# Patient Record
Sex: Male | Born: 1940 | Race: Black or African American | Hispanic: No | Marital: Single | Smoking: Former smoker
Health system: Southern US, Community
[De-identification: ages and names within clinical notes are randomized; demographics above are authoritative.]

## PROBLEM LIST (undated history)

## (undated) DIAGNOSIS — F410 Panic disorder [episodic paroxysmal anxiety] without agoraphobia: Secondary | ICD-10-CM

## (undated) DIAGNOSIS — J449 Chronic obstructive pulmonary disease, unspecified: Secondary | ICD-10-CM

---

## 2004-05-05 ENCOUNTER — Emergency Department: Payer: Self-pay | Admitting: Emergency Medicine

## 2004-06-11 ENCOUNTER — Emergency Department: Payer: Self-pay | Admitting: Emergency Medicine

## 2005-01-09 ENCOUNTER — Emergency Department: Payer: Self-pay | Admitting: Emergency Medicine

## 2005-09-02 ENCOUNTER — Emergency Department: Payer: Self-pay | Admitting: Emergency Medicine

## 2005-09-10 ENCOUNTER — Ambulatory Visit: Payer: Self-pay | Admitting: Internal Medicine

## 2005-10-11 ENCOUNTER — Ambulatory Visit: Payer: Self-pay | Admitting: Internal Medicine

## 2005-10-26 ENCOUNTER — Ambulatory Visit: Payer: Self-pay | Admitting: Internal Medicine

## 2007-01-01 ENCOUNTER — Emergency Department: Payer: Self-pay | Admitting: Emergency Medicine

## 2007-02-16 ENCOUNTER — Other Ambulatory Visit: Payer: Self-pay

## 2007-02-16 ENCOUNTER — Emergency Department: Payer: Self-pay | Admitting: Emergency Medicine

## 2007-03-03 ENCOUNTER — Ambulatory Visit: Payer: Self-pay | Admitting: Cardiology

## 2007-06-05 ENCOUNTER — Emergency Department: Payer: Self-pay | Admitting: Internal Medicine

## 2007-07-24 ENCOUNTER — Emergency Department: Payer: Self-pay | Admitting: Emergency Medicine

## 2008-01-30 ENCOUNTER — Emergency Department: Payer: Self-pay | Admitting: Emergency Medicine

## 2008-01-30 ENCOUNTER — Other Ambulatory Visit: Payer: Self-pay

## 2009-07-16 ENCOUNTER — Emergency Department: Payer: Self-pay | Admitting: Emergency Medicine

## 2009-09-17 ENCOUNTER — Emergency Department: Payer: Self-pay | Admitting: Emergency Medicine

## 2010-09-30 ENCOUNTER — Emergency Department: Payer: Self-pay | Admitting: Unknown Physician Specialty

## 2010-11-10 NOTE — Assessment & Plan Note (Signed)
[facility] OFFICE NOTE   NAME:Chris Finley, Chris Finley                         MRN:          [id number]  DATE:03/03/2007                            DOB:          14-Mar-1941    CARDIOLOGY CONSULTATION  I was asked by Dr. Corrinne Eagle to follow up with Chris Finley, a very  pleasant 70 year old black gentleman who had presyncope on the morning  of February 22, 2007.   That morning, he had bent over to tie his shoes; and then tried to  straighten up, and got lightheaded.  He had no other symptoms.  He fell  over, and was unable to get up.  He felt nauseated, and then vomited.  He did not lose consciousness.  He went to the emergency room where he  had complete evaluation.   His EKG showed normal sinus rhythm with left axis deviation and left  anterior fascicular block.  They called it a right bundle, but I do not  think that it is a true right bundle.  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  This was  in the left frontal, subcortical area.  Chest x-ray showed no acute  cardiopulmonary disease.  He had COPD and hyperinflated lungs.  His  blood work showed a negative CPK/MB, normal CBC, except for platelet  count of 121,000; normal comprehensive metabolic panel, except a  nonfasting blood sugar of 161; and a negative troponin.  His D-dimer was  borderline at 0.53.  He was discharged home.  He relates a couple of  episodes like this in the past, over the last five years, he never lost  consciousness.   PAST MEDICAL HISTORY:  He is currently on no medications.  He has no allergies.  He does smoke, about a pack per day, and has for 45 years.  He does not  drink alcohol.  He drinks caffeinated beverages.   SURGICAL HISTORY:  None.   FAMILY HISTORY:  Noncontributory.   SOCIAL HISTORY:  Single.  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   REVIEW OF SYSTEMS:   Other than in the HPI is negative.  He has a history  of some anxiety, otherwise negative.   PHYSICAL EXAM:  He is very pleasant.  His blood pressure lying was 113/72, heart rate of 87, when we sat him  up his blood pressure went to 125/90 and heart rate increased to 93; and  then standing it dropped to 118/80, after two minutes 112/75, and after  five minutes 117/79 with no change in heart rate.  He was not  symptomatic.  His height is 5 feet 7 inches.  He weighs 149 pounds.  HEENT:  Normocephalic, atraumatic.  PERRLA.  Extraocular movements are  intact.  Sclerae muddy.  He has a mustache.  Facial symmetry is normal.  NECK:  Carotid upstrokes are equal bilaterally without bruits.  No JVD.  Thyroid is not enlarged.  Trachea is midline.  LUNGS:  Revealed  decreased breath sounds throughout, but no rales or  rhonchi.  HEART:  Reveals a nondisplaced PMI.  He has a split S1, normal S2.  No  gallop or rub.  ABDOMINAL EXAM:  Soft, good bowel sounds, no midline bruit, no  hepatomegaly.  EXTREMITIES:  No cyanosis clubbing or edema.  There is no sign of DVT.  Pulses are intact.  NEUROLOGIC:  Grossly intact.  SKIN:  Unremarkable.   ASSESSMENT:  1. Orthostatic hypotension.  Though he does not drop his pressure it      is blunted; and his history and symptoms go along with orthostasis.  2. Left anterior fascicular block.  This is totally unrelated.  3. Severe chronic obstructive pulmonary disease.  RECOMMENDATION.  1. Stay well-hydrated.  2. Orthostatic precautions and how to avoid this were reviewed at      length.  3. Discontinue smoking or cut back as much as possible.  4. We will see him back on a p.r.n. basis.     Thomas C. Daleen Squibb, MD, [facility]  Electronically Signed    TCW/MedQ  DD: 03/03/2007  DT: 03/03/2007  Job #: [id number]   cc:   [facility] [address] [place]  [place] [address]  Corrinne Eagle -- Emergency room

## 2012-01-04 ENCOUNTER — Emergency Department: Payer: Self-pay | Admitting: Emergency Medicine

## 2013-05-06 ENCOUNTER — Emergency Department: Payer: Self-pay | Admitting: Emergency Medicine

## 2013-05-06 LAB — CBC
HCT: 45.3 % (ref 40.0–52.0)
HGB: 14.9 g/dL (ref 13.0–18.0)
MCH: 28.8 pg (ref 26.0–34.0)
MCV: 87 fL (ref 80–100)
RBC: 5.18 10*6/uL (ref 4.40–5.90)
RDW: 15.5 % — ABNORMAL HIGH (ref 11.5–14.5)
WBC: 4 10*3/uL (ref 3.8–10.6)

## 2013-05-06 LAB — URINALYSIS, COMPLETE
Bacteria: NONE SEEN
Blood: NEGATIVE
Glucose,UR: 50 mg/dL (ref 0–75)
Ketone: NEGATIVE
Leukocyte Esterase: NEGATIVE
Ph: 6 (ref 4.5–8.0)
Protein: NEGATIVE
RBC,UR: 1 /HPF (ref 0–5)
Squamous Epithelial: NONE SEEN
WBC UR: 1 /HPF (ref 0–5)

## 2013-05-06 LAB — TROPONIN I: Troponin-I: <0.02 ng/mL

## 2013-05-06 LAB — BASIC METABOLIC PANEL
Anion Gap: 3 — ABNORMAL LOW (ref 7–16)
EGFR (African American): >60
Glucose: 172 mg/dL — ABNORMAL HIGH (ref 65–99)
Sodium: 140 mmol/L (ref 136–145)

## 2013-06-01 ENCOUNTER — Ambulatory Visit: Payer: Self-pay | Admitting: Otolaryngology

## 2014-04-14 IMAGING — CT CT ORBITS WITHOUT CONTRAST
3 of 6 series · 15 of 40 positions shown, 18 images · non-contrast
Comparison: CT head 02/16/2007

CLINICAL DATA: Right-sided hearing loss.  Rule out cholesteatoma

EXAM:
CT TEMPORAL BONES WITHOUT CONTRAST
TECHNIQUE: Axial and coronal plane CT imaging of the petrous temporal bones was
performed with thin-collimation image reconstruction. No intravenous
contrast was administered. Multiplanar CT image reconstructions were
also generated.

[Series 3: ax soft · axial · 0.33mm/px · z∈[-2,+14]mm · 2 of 26 slices shown]
[im 9/26  brain]
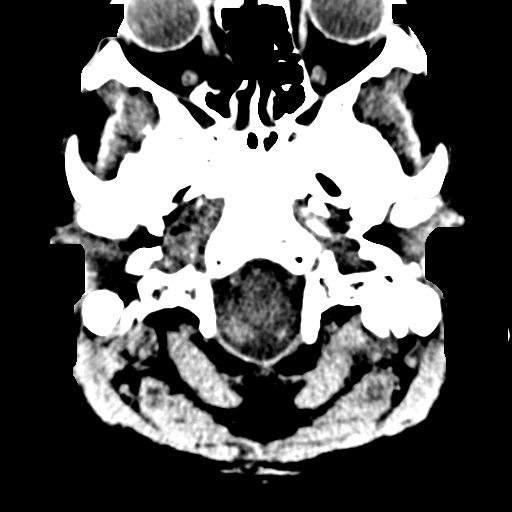
[im 17/26  brain]
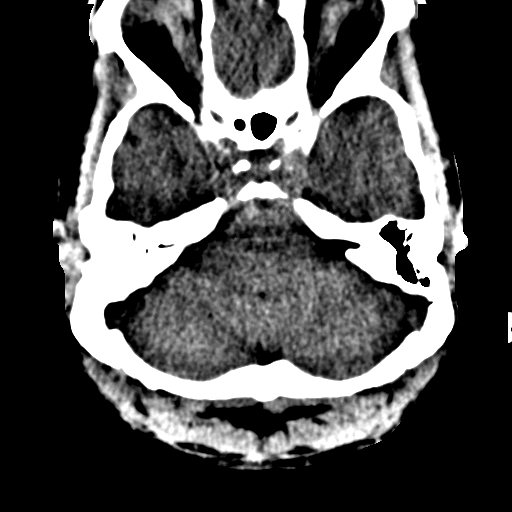

[Series 5: ax mag right · axial · 0.20mm/px · z∈[-14,+28]mm · 11 of 86 slices shown, 14 images]
[im 8/86  brain]
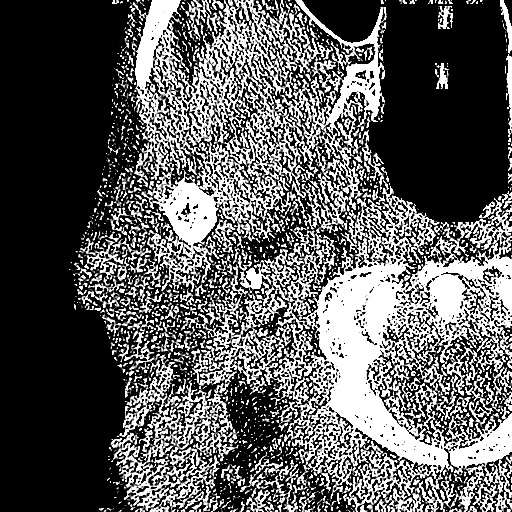
[im 8/86  bone]
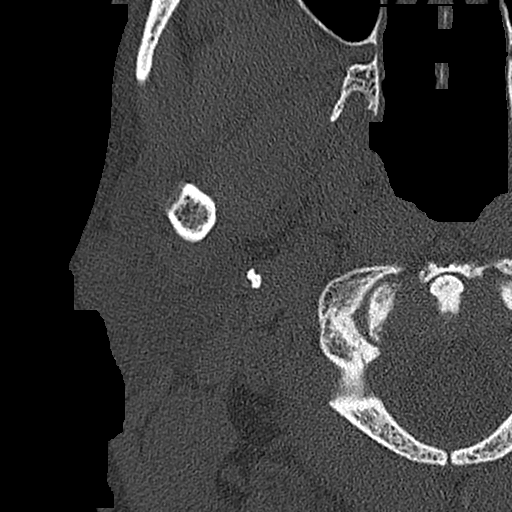
[im 15/86  bone]
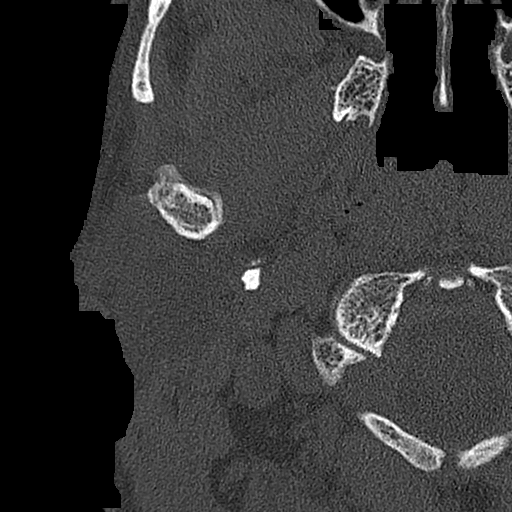
[im 22/86  bone]
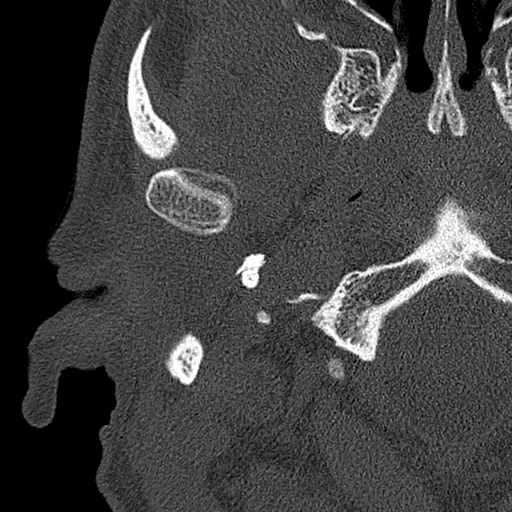
[im 29/86  bone]
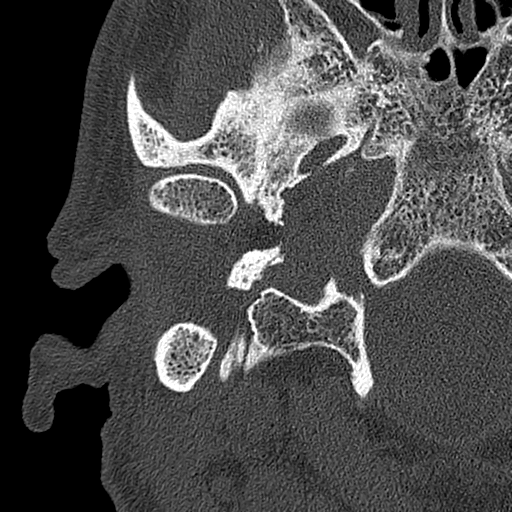
[im 36/86  brain]
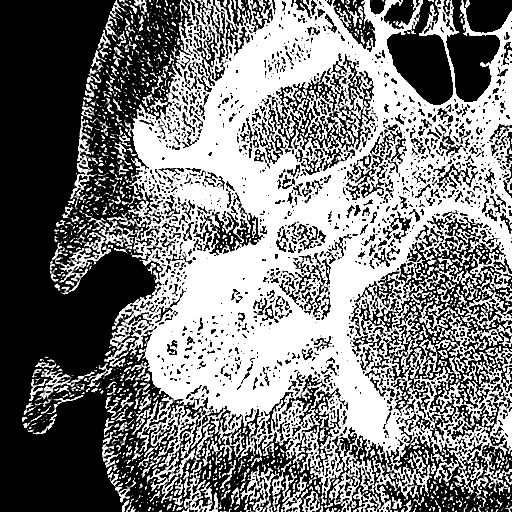
[im 36/86  bone]
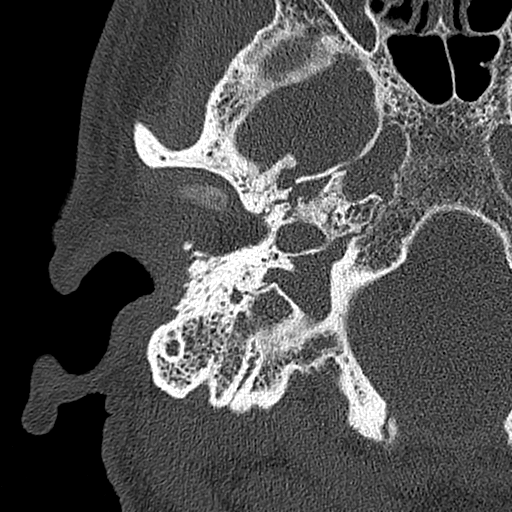
[im 43/86  bone]
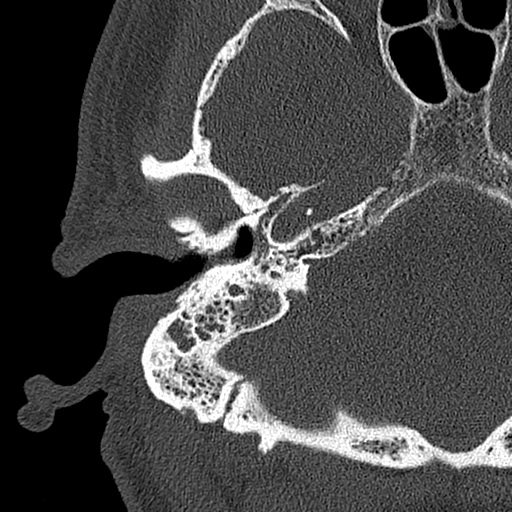
[im 50/86  bone]
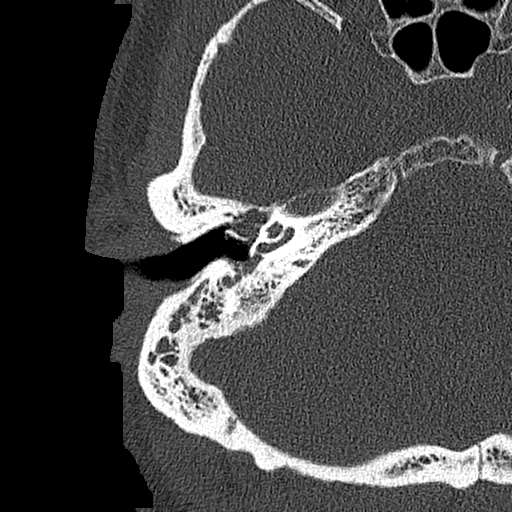
[im 57/86  bone]
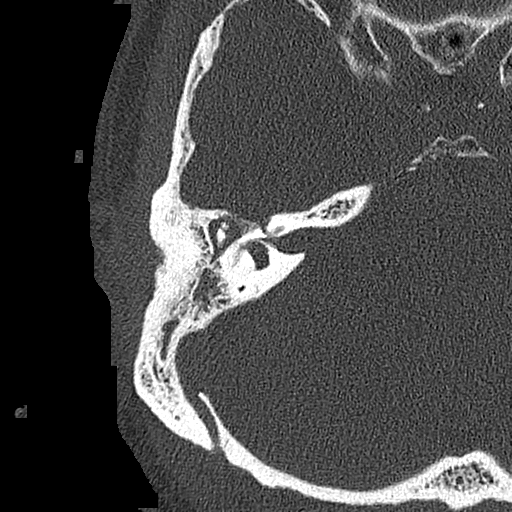
[im 64/86  brain]
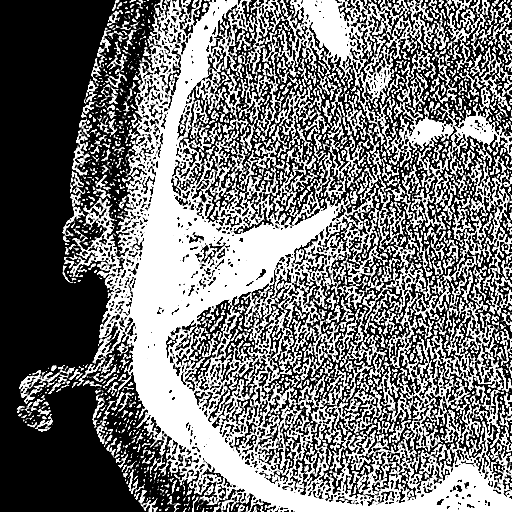
[im 64/86  bone]
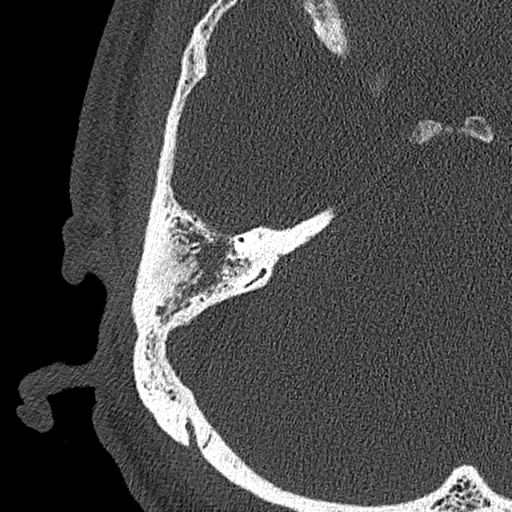
[im 71/86  bone]
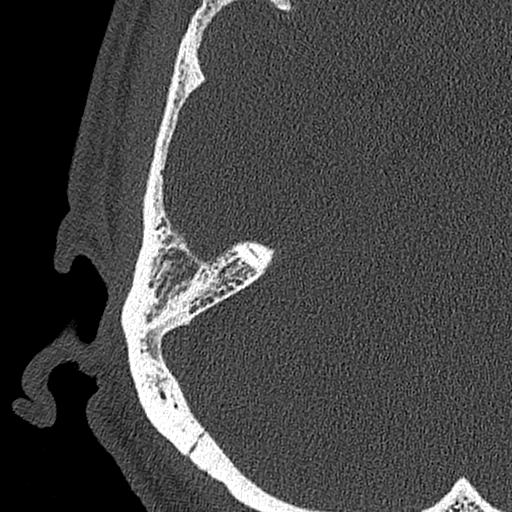
[im 78/86  bone]
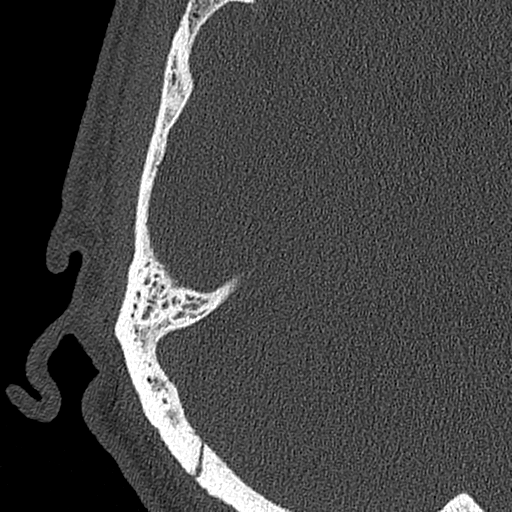

[Series 6: cor mag right · coronal · 0.20mm/px · 2 of 106 slices shown]
[im 36/106  bone]
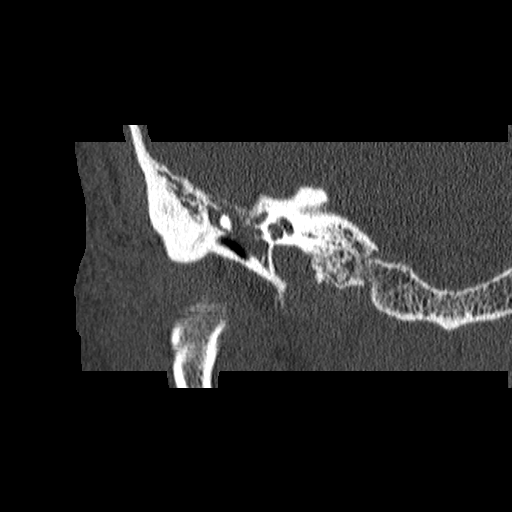
[im 71/106  bone]
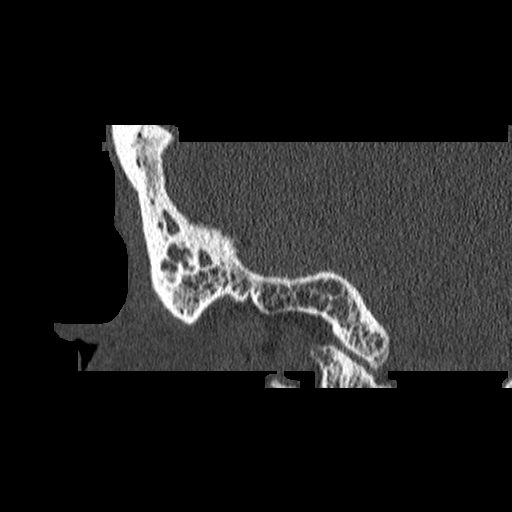

[15 of 40 positions shown; findings below may reference images not displayed]

FINDINGS: Mild mucosal thickening in the visualized paranasal sinuses. Chronic
fracture right medial orbit with herniation of fat and medial rectus
into the nasal cavity. This was also present in 6229. Visualized
intracranial contents are negative.

Right temporal bone: Complete opacification the right mastoid sinus
and right middle ear. Ossicles are surrounded by fluid or soft
tissue density. No bony erosion or mass effect. Scutum intact.
Internal auditory canal is normal in caliber. Cochlea and
semicircular canals are normal. Vestibular aqueduct normal in
caliber.

Left temporal bone: Left mastoid sinus is well developed and clear.
Left middle ear is clear. Ossicles are normal. Negative for mass or
cholesteatoma. Inner ear structures are normal.
IMPRESSION: Right mastoid sinus effusion and middle ear effusion. No mass effect
or bony erosion to suggest cholesteatoma. Limited views of this area
in 6229 reveal a similar appearance.

Chronic medial orbit fracture unchanged from 6229.

## 2014-07-31 ENCOUNTER — Emergency Department: Payer: Self-pay | Admitting: Emergency Medicine

## 2014-10-29 ENCOUNTER — Other Ambulatory Visit: Payer: Self-pay

## 2014-10-29 ENCOUNTER — Encounter: Payer: Self-pay | Admitting: Emergency Medicine

## 2014-10-29 ENCOUNTER — Emergency Department: Payer: Medicare Other

## 2014-10-29 ENCOUNTER — Emergency Department
Admission: EM | Admit: 2014-10-29 | Discharge: 2014-10-29 | Disposition: A | Discharge status: Home / Self Care | Payer: Medicare Other | Attending: Emergency Medicine | Admitting: Emergency Medicine

## 2014-10-29 DIAGNOSIS — J441 Chronic obstructive pulmonary disease with (acute) exacerbation: Secondary | ICD-10-CM | POA: Insufficient documentation

## 2014-10-29 DIAGNOSIS — Z87891 Personal history of nicotine dependence: Secondary | ICD-10-CM | POA: Diagnosis not present

## 2014-10-29 DIAGNOSIS — R0602 Shortness of breath: Secondary | ICD-10-CM | POA: Diagnosis present

## 2014-10-29 DIAGNOSIS — Z79899 Other long term (current) drug therapy: Secondary | ICD-10-CM | POA: Insufficient documentation

## 2014-10-29 HISTORY — DX: Chronic obstructive pulmonary disease, unspecified: J44.9

## 2014-10-29 LAB — CBC
HEMATOCRIT: 48.8 % (ref 40.0–52.0)
Hemoglobin: 16.1 g/dL (ref 13.0–18.0)
MCH: 28.9 pg (ref 26.0–34.0)
MCHC: 33 g/dL (ref 32.0–36.0)
MCV: 87.5 fL (ref 80.0–100.0)
Platelets: 84 10*3/uL — ABNORMAL LOW (ref 150–440)
RBC: 5.57 MIL/uL (ref 4.40–5.90)
RDW: 15.4 % — ABNORMAL HIGH (ref 11.5–14.5)
WBC: 4 10*3/uL (ref 3.8–10.6)

## 2014-10-29 LAB — BRAIN NATRIURETIC PEPTIDE: B Natriuretic Peptide: 31 pg/mL (ref 0.0–100.0)

## 2014-10-29 MED ORDER — PREDNISONE 20 MG PO TABS
40.0000 mg | ORAL_TABLET | Freq: Every day | ORAL | Status: DC
Start: 2014-10-29 — End: 2015-03-14

## 2014-10-29 MED ORDER — IPRATROPIUM-ALBUTEROL 0.5-2.5 (3) MG/3ML IN SOLN
RESPIRATORY_TRACT | Status: AC
  Administered 2014-10-29: 3 mL via RESPIRATORY_TRACT
  Filled 2014-10-29: qty 3

## 2014-10-29 MED ORDER — IPRATROPIUM-ALBUTEROL 0.5-2.5 (3) MG/3ML IN SOLN
RESPIRATORY_TRACT | Status: AC
  Administered 2014-10-29: 3 mL via RESPIRATORY_TRACT
  Filled 2014-10-29: qty 6

## 2014-10-29 MED ORDER — PREDNISONE 20 MG PO TABS
60.0000 mg | ORAL_TABLET | Freq: Once | ORAL | Status: AC
  Administered 2014-10-29: 60 mg via ORAL

## 2014-10-29 MED ORDER — PREDNISONE 20 MG PO TABS
ORAL_TABLET | ORAL | Status: AC
  Administered 2014-10-29: 60 mg via ORAL
  Filled 2014-10-29: qty 3

## 2014-10-29 MED ORDER — AZITHROMYCIN 250 MG PO TABS: ORAL_TABLET | ORAL | Status: DC

## 2014-10-29 MED ORDER — IPRATROPIUM-ALBUTEROL 0.5-2.5 (3) MG/3ML IN SOLN
3.0000 mL | RESPIRATORY_TRACT | Status: AC
  Administered 2014-10-29 (×3): 3 mL via RESPIRATORY_TRACT

## 2014-10-29 MED ORDER — IPRATROPIUM-ALBUTEROL 0.5-2.5 (3) MG/3ML IN SOLN: 3.0000 mL | RESPIRATORY_TRACT | Status: DC

## 2014-10-29 NOTE — ED Notes (Signed)
MD at bedside. Educating pt about plan of care.

## 2014-10-29 NOTE — Discharge Instructions (Signed)
Chronic Asthmatic Bronchitis Chronic asthmatic bronchitis is a complication of persistent asthma. After a period of time with asthma, some people develop airflow obstruction that is present all the time, even when not having an asthma attack.There is also persistent inflammation of the airways, and the bronchial tubes produce more mucus. Chronic asthmatic bronchitis usually is a permanent problem with the lungs. CAUSES  Chronic asthmatic bronchitis happens most often in people who have asthma and also smoke cigarettes. Occasionally, it can happen to a person with [place]-standing or severe asthma even if the person is not a smoker. SIGNS AND SYMPTOMS  Chronic asthmatic bronchitis usually causes symptoms of both asthma and chronic bronchitis, including:   Coughing.  Increased sputum production.  Wheezing and shortness of breath.  Chest discomfort.  Recurring infections. DIAGNOSIS  Your health care provider will take a medical history and perform a physical exam. Chronic asthmatic bronchitis is suspected when a person with asthma has abnormal results on breathing tests (pulmonary function tests) even when breathing symptoms are at their best. Other tests, such as a chest X-ray, may be performed to rule out other conditions.  TREATMENT  Treatment involves controlling symptoms with medicine and lifestyle changes.  Your health care provider may prescribe asthma medicines, including inhaler and nebulizer medicines.  Infection can be treated with medicine to kill germs (antibiotics). Serious infections may require hospitalization. These can include:  Pneumonia.  Sinus infections.  Acute bronchitis.   Preventing infection and hospitalization is very important. Get an influenza vaccination every year as directed by your health care provider. Ask your health care provider whether you need a pneumonia vaccine.  Ask your health care provider whether you would benefit from a pulmonary  rehabilitation program. HOME CARE INSTRUCTIONS  Take medicines only as directed by your health care provider.  If you are a cigarette smoker, the most important thing that you can do is quit. Talk to your health care provider for help with quitting smoking.  Avoid pollen, dust, animal dander, molds, smoke, and other things that cause attacks.  Regular exercise is very important to help you feel better. Discuss possible exercise routines with your health care provider.  If animal dander is the cause of asthma, you may not be able to keep pets.  It is important that you:  Become educated about your medical condition.  Participate in maintaining wellness.  Seek medical care as directed. Delay in seeking medical care could cause permanent injury and may be a risk to your life. SEEK MEDICAL CARE IF:  You have wheezing and shortness of breath even if taking medicine to prevent attacks.  You have muscle aches, chest pain, or thickening of sputum.  Your sputum changes from clear or white to yellow, green, gray, or bloody. SEEK IMMEDIATE MEDICAL CARE IF:  Your usual medicines do not stop your wheezing.  You have increased coughing or shortness of breath or both.  You have increased difficulty breathing.  You have any problems from the medicine you are taking, such as a rash, itching, swelling, or trouble breathing. MAKE SURE YOU:   Understand these instructions.  Will watch your condition.  Will get help right away if you are not doing well or get worse. Document Released: 04/01/2006 Document Revised: 10/29/2013 Document Reviewed: 07/23/2013 ExitCare Patient Information 2015 [employer]. This information is not intended to replace advice given to you by your health care provider. Make sure you discuss any questions you have with your health care provider.  

## 2014-10-29 NOTE — ED Notes (Signed)
Pt informed to return if any life threatening symptoms occur.  

## 2014-10-29 NOTE — ED Notes (Signed)
Patient is resting comfortably. 

## 2014-10-29 NOTE — ED Notes (Signed)
Pt. Ambulatory to bathroom at this time. No acute distress noted.

## 2014-10-29 NOTE — ED Notes (Signed)
Patient is resting comfortably. Chest Xray being performed at this time.

## 2014-10-29 NOTE — ED Provider Notes (Signed)
[facility] Emergency Department Provider Note  ____________________________________________  Time seen: 12:20 PM  I have reviewed the triage vital signs and the nursing notes.   HISTORY  Chief Complaint Shortness of Breath    HPI Chris Finley is a 74 y.o. male who complains of shortness of breath for the last 3 or 4 days. He cannot recall a trigger for this. Denies any chest pain. He reports dyspnea on exertion per G-tube with walking up stairs. No fever or chills. He is having a cough which is nonproductive. No abdominal pain, nausea, vomiting, lightheadedness.     Past Medical History  Diagnosis Date  . COPD (chronic obstructive pulmonary disease)     There are no active problems to display for this patient.   No past surgical history on file.  Current Outpatient Rx  Name  Route  Sig  Dispense  Refill  . citalopram (CELEXA) 20 MG tablet   Oral   Take 20 mg by mouth daily.         Marland Kitchen azithromycin (ZITHROMAX Z-PAK) 250 MG tablet      Take 2 tablets (500 mg) on  Day 1,  followed by 1 tablet (250 mg) once daily on Days 2 through 5.   6 each   0   . predniSONE (DELTASONE) 20 MG tablet   Oral   Take 2 tablets (40 mg total) by mouth daily.   8 tablet   0     Allergies Review of patient's allergies indicates no known allergies.  No family history on file.  Social History History  Substance Use Topics  . Smoking status: Former Research scientist (life sciences)  . Smokeless tobacco: Not on file  . Alcohol Use: No    Review of Systems  Constitutional: No fever or chills. No weight changes Eyes:No blurry vision or double vision.  ENT: No sore throat. Cardiovascular: No chest pain. Respiratory: Per history of present illness. Gastrointestinal: Negative for abdominal pain, vomiting and diarrhea.  No BRBPR or melena. Genitourinary: Negative for dysuria, urinary retention, bloody urine, or difficulty urinating. Musculoskeletal: Negative for back pain. No  joint swelling or pain. Skin: Negative for rash. Neurological: Negative for headaches, focal weakness or numbness. Psychiatric:No anxiety or depression.   Endocrine:No hot/cold intolerance, changes in energy, or sleep difficulty.  10-point ROS otherwise negative.  ____________________________________________   PHYSICAL EXAM:  VITAL SIGNS: ED Triage Vitals  Enc Vitals Group     BP 10/29/14 1126 101/67 mmHg     Pulse Rate 10/29/14 1126 102     Resp 10/29/14 1200 18     Temp 10/29/14 1126 97.4 F (36.3 C)     Temp Source 10/29/14 1126 Oral     SpO2 10/29/14 1126 88 %     Weight 10/29/14 1126 149 lb (67.586 kg)     Height 10/29/14 1126 5\' 7"  (1.702 m)     Head Cir --      Peak Flow --      Pain Score --      Pain Loc --      Pain Edu? --      Excl. in [place]? --      Constitutional: Alert and oriented. Well appearing and in no distress. Eyes: No scleral icterus. No conjunctival pallor. PERRL. EOMI ENT   Head: Normocephalic and atraumatic.   Nose: No congestion/rhinnorhea. No septal hematoma   Mouth/Throat: MMM, no pharyngeal erythema   Neck: No stridor. No SubQ emphysema.  Hematological/Lymphatic/Immunilogical: No cervical lymphadenopathy. Cardiovascular:  RRR. Normal and symmetric distal pulses are present in all extremities. No murmurs, rubs, or gallops. Respiratory: Slightly prolonged expiratory phase. Diffuse expiratory wheezing. No crackles or rhonchi. Normal work of breathing, no respiratory distress. Gastrointestinal: Soft and nontender. No distention. There is no CVA tenderness.  No rebound, rigidity, or guarding. Genitourinary: deferred Musculoskeletal: Nontender with normal range of motion in all extremities. No joint effusions.  No lower extremity tenderness.  No edema. Neurologic:   Normal speech and language.  CN 2-10 normal. Motor grossly intact. No pronator drift.  Normal gait. No gross focal neurologic deficits are appreciated.  Skin:  Skin is  warm, dry and intact. No rash noted.  No petechiae, purpura, or bullae. Psychiatric: Mood and affect are normal. Speech and behavior are normal. Patient exhibits appropriate insight and judgment.  ____________________________________________    LABS (pertinent positives/negatives)  Results for orders placed or performed during the hospital encounter of 10/29/14  CBC  Result Value Ref Range   WBC 4.0 3.8 - 10.6 K/uL   RBC 5.57 4.40 - 5.90 MIL/uL   Hemoglobin 16.1 13.0 - 18.0 g/dL   HCT 48.8 40.0 - 52.0 %   MCV 87.5 80.0 - 100.0 fL   MCH 28.9 26.0 - 34.0 pg   MCHC 33.0 32.0 - 36.0 g/dL   RDW 15.4 (H) 11.5 - 14.5 %   Platelets 84 (L) 150 - 440 K/uL  Brain natriuretic peptide (only with dyspnea)  Result Value Ref Range   B Natriuretic Peptide 31.0 0.0 - 100.0 pg/mL     ____________________________________________   EKG  Normal sinus rhythm rate of 99. Left axis left bundle branch block. Normal ST segments and T-wave inversion in V2 which is nonspecific.  ____________________________________________    RADIOLOGY  Chest x-ray unremarkable  ____________________________________________   PROCEDURES  ____________________________________________   INITIAL IMPRESSION / ASSESSMENT AND PLAN / ED COURSE  Pertinent labs & imaging results that were available during my care of the patient were reviewed by me and considered in my medical decision making (see chart for details).  The patient presents with a COPD exacerbation. We'll give him prednisone and 3 DuoNeb and reassessed. He initially is hypoxic on room air to 88%, so he feels better on nasal cannula at this time. We will reassess after his treatments.  ----------------------------------------- 3:10 PM on 10/29/2014 -----------------------------------------  The patient feels much better. He is ambulatory to the bathroom. Upon returning to his bed history of MRSA oxygen saturation is 94-95%. He still has a slight  wheeze diffusely but he feels back to his baseline and wants to go home. His expiratory phase is normal. No evidence of pneumonia, sepsis, ACS, dissection, PE. Also discharge him on prednisone and azithromycin. He has Combivent which she'll continue using and follow up with his primary care doctor at the [facility]. The patient is medically stable and good condition.  ____________________________________________   FINAL CLINICAL IMPRESSION(S) / ED DIAGNOSES  Final diagnoses:  COPD with acute exacerbation      Carrie Mew, MD 10/29/14 1511

## 2014-10-29 NOTE — ED Notes (Signed)
Pt. Placed on 1L of O2 due to Room Air of 88%. Pt alert and oriented.

## 2014-10-29 NOTE — ED Notes (Signed)
Pt reports that he has COPD, lately he has been getting more SOB with his daily activities. Pt is able is taking a few deep breaths in between every 3rd or 4th word.

## 2014-10-30 LAB — COMPREHENSIVE METABOLIC PANEL
ALT: 10 U/L — ABNORMAL LOW (ref 17–63)
AST: 23 U/L (ref 15–41)
Albumin: 3.8 g/dL (ref 3.5–5.0)
Alkaline Phosphatase: 92 U/L (ref 38–126)
Anion gap: 9 (ref 5–15)
BILIRUBIN TOTAL: 0.7 mg/dL (ref 0.3–1.2)
BUN: 16 mg/dL (ref 6–20)
CO2: 25 mmol/L (ref 22–32)
CREATININE: 1.06 mg/dL (ref 0.61–1.24)
Calcium: 9.2 mg/dL (ref 8.9–10.3)
Chloride: 108 mmol/L (ref 101–111)
GFR calc non Af Amer: >60 mL/min (ref >60)
Glucose, Bld: 132 mg/dL — ABNORMAL HIGH (ref 65–99)
Potassium: 3.8 mmol/L (ref 3.5–5.1)
SODIUM: 142 mmol/L (ref 135–145)
Total Protein: 7.7 g/dL (ref 6.5–8.1)

## 2014-10-30 LAB — TROPONIN I

## 2015-03-14 ENCOUNTER — Emergency Department: Payer: Medicare Other

## 2015-03-14 ENCOUNTER — Encounter: Payer: Self-pay | Admitting: Emergency Medicine

## 2015-03-14 ENCOUNTER — Emergency Department
Admission: EM | Admit: 2015-03-14 | Discharge: 2015-03-14 | Disposition: A | Discharge status: Home / Self Care | Payer: Medicare Other | Attending: Emergency Medicine | Admitting: Emergency Medicine

## 2015-03-14 DIAGNOSIS — R0602 Shortness of breath: Secondary | ICD-10-CM | POA: Diagnosis present

## 2015-03-14 DIAGNOSIS — J441 Chronic obstructive pulmonary disease with (acute) exacerbation: Secondary | ICD-10-CM | POA: Insufficient documentation

## 2015-03-14 DIAGNOSIS — Z87891 Personal history of nicotine dependence: Secondary | ICD-10-CM | POA: Insufficient documentation

## 2015-03-14 DIAGNOSIS — Z79899 Other long term (current) drug therapy: Secondary | ICD-10-CM | POA: Insufficient documentation

## 2015-03-14 DIAGNOSIS — Z792 Long term (current) use of antibiotics: Secondary | ICD-10-CM | POA: Diagnosis not present

## 2015-03-14 HISTORY — DX: Panic disorder (episodic paroxysmal anxiety): F41.0

## 2015-03-14 LAB — CBC WITH DIFFERENTIAL/PLATELET
Basophils Absolute: 0 10*3/uL (ref 0–0.1)
Basophils Relative: 1 %
Eosinophils Absolute: 0.3 10*3/uL (ref 0–0.7)
Eosinophils Relative: 9 %
HCT: 45.5 % (ref 40.0–52.0)
Hemoglobin: 14.8 g/dL (ref 13.0–18.0)
LYMPHS ABS: 1.1 10*3/uL (ref 1.0–3.6)
Lymphocytes Relative: 30 %
MCH: 28.4 pg (ref 26.0–34.0)
MCHC: 32.6 g/dL (ref 32.0–36.0)
MCV: 87.2 fL (ref 80.0–100.0)
Monocytes Absolute: 0.7 10*3/uL (ref 0.2–1.0)
Monocytes Relative: 20 %
Neutro Abs: 1.4 10*3/uL (ref 1.4–6.5)
Neutrophils Relative %: 40 %
PLATELETS: 102 10*3/uL — AB (ref 150–440)
RBC: 5.22 MIL/uL (ref 4.40–5.90)
RDW: 15.4 % — ABNORMAL HIGH (ref 11.5–14.5)
WBC: 3.6 10*3/uL — AB (ref 3.8–10.6)

## 2015-03-14 LAB — BASIC METABOLIC PANEL
Anion gap: 6 (ref 5–15)
BUN: 19 mg/dL (ref 6–20)
CHLORIDE: 109 mmol/L (ref 101–111)
CO2: 27 mmol/L (ref 22–32)
Calcium: 9.4 mg/dL (ref 8.9–10.3)
Creatinine, Ser: 1.1 mg/dL (ref 0.61–1.24)
GFR calc Af Amer: >60 mL/min (ref >60)
GLUCOSE: 128 mg/dL — AB (ref 65–99)
POTASSIUM: 3.7 mmol/L (ref 3.5–5.1)
Sodium: 142 mmol/L (ref 135–145)

## 2015-03-14 LAB — TROPONIN I: Troponin I: <0.03 ng/mL (ref <0.031)

## 2015-03-14 MED ORDER — IPRATROPIUM-ALBUTEROL 0.5-2.5 (3) MG/3ML IN SOLN
3.0000 mL | Freq: Once | RESPIRATORY_TRACT | Status: AC
  Administered 2015-03-14: 3 mL via RESPIRATORY_TRACT
  Filled 2015-03-14: qty 3

## 2015-03-14 MED ORDER — METHYLPREDNISOLONE SODIUM SUCC 125 MG IJ SOLR
125.0000 mg | Freq: Once | INTRAMUSCULAR | Status: AC
  Administered 2015-03-14: 125 mg via INTRAVENOUS
  Filled 2015-03-14: qty 2

## 2015-03-14 MED ORDER — PREDNISONE 20 MG PO TABS: 40.0000 mg | ORAL_TABLET | Freq: Every day | ORAL | Status: DC

## 2015-03-14 NOTE — Discharge Instructions (Signed)
Please seek medical attention for any high fevers, chest pain, shortness of breath, change in behavior, persistent vomiting, bloody stool or any other new or concerning symptoms. ° °Chronic Obstructive Pulmonary Disease Exacerbation °Chronic obstructive pulmonary disease (COPD) is a common lung condition in which airflow from the lungs is limited. COPD is a general term that can be used to describe many different lung problems that limit airflow, including chronic bronchitis and emphysema. COPD exacerbations are episodes when breathing symptoms become much worse and require extra treatment. Without treatment, COPD exacerbations can be life threatening, and frequent COPD exacerbations can cause further damage to your lungs. °CAUSES  °· Respiratory infections.   °· Exposure to smoke.   °· Exposure to air pollution, chemical fumes, or dust. °Sometimes there is no apparent cause or trigger. °RISK FACTORS °· Smoking cigarettes. °· Older age. °· Frequent prior COPD exacerbations. °SIGNS AND SYMPTOMS  °· Increased coughing.   °· Increased thick spit (sputum) production.   °· Increased wheezing.   °· Increased shortness of breath.   °· Rapid breathing.   °· Chest tightness. °DIAGNOSIS  °Your medical history, a physical exam, and tests will help your health care provider make a diagnosis. Tests may include: °· A chest X-ray. °· Basic lab tests. °· Sputum testing. °· An arterial blood gas test. °TREATMENT  °Depending on the severity of your COPD exacerbation, you may need to be admitted to a hospital for treatment. Some of the treatments commonly used to treat COPD exacerbations are:  °· Antibiotic medicines.   °· Bronchodilators. These are drugs that expand the air passages. They may be given with an inhaler or nebulizer. Spacer devices may be needed to help improve drug delivery. °· Corticosteroid medicines. °· Supplemental oxygen therapy.   °HOME CARE INSTRUCTIONS  °· Do not smoke. Quitting smoking is very important to  prevent COPD from getting worse and exacerbations from happening as often. °· Avoid exposure to all substances that irritate the airway, especially to tobacco smoke.   °· If you were prescribed an antibiotic medicine, finish it all even if you start to feel better. °· Take all medicines as directed by your health care provider. It is important to use correct technique with inhaled medicines. °· Drink enough fluids to keep your urine clear or pale yellow (unless you have a medical condition that requires fluid restriction). °· Use a cool mist vaporizer. This makes it easier to clear your chest when you cough.   °· If you have a home nebulizer and oxygen, continue to use them as directed.   °· Maintain all necessary vaccinations to prevent infections.   °· Exercise regularly.   °· Eat a healthy diet.   °· Keep all follow-up appointments as directed by your health care provider. °SEEK IMMEDIATE MEDICAL CARE IF: °· You have worsening shortness of breath.   °· You have trouble talking.   °· You have severe chest pain. °· You have blood in your sputum.  °· You have a fever. °· You have weakness, vomit repeatedly, or faint.   °· You feel confused.   °· You continue to get worse. °MAKE SURE YOU:  °· Understand these instructions. °· Will watch your condition. °· Will get help right away if you are not doing well or get worse. °Document Released: 04/11/2007 Document Revised: 10/29/2013 Document Reviewed: 02/16/2013 °[employer]® Patient Information ©2015 [employer]. This information is not intended to replace advice given to you by your health care provider. Make sure you discuss any questions you have with your health care provider. ° °

## 2015-03-14 NOTE — ED Provider Notes (Signed)
[facility] Emergency Department Provider Note   ____________________________________________  Time seen: 1250  I have reviewed the triage vital signs and the nursing notes.   HISTORY  Chief Complaint Shortness of Breath   History limited by: Not Limited   HPI Chris Finley is a 74 y.o. male with history of COPD who presents to the emergency department today with increasing shortness of breath. The patient states that for the past 4-5 days he has had progressive shortness of breath. He finds that it is worse with exertion. He states that he has been trying to his Combivent inhaler at home with minimal relief. He denies any chest pain. He has had some accompanied cough productive of dark greenish phlegm. No fevers.    Past Medical History  Diagnosis Date  . COPD (chronic obstructive pulmonary disease)   . Panic attack     There are no active problems to display for this patient.   No past surgical history on file.  Current Outpatient Rx  Name  Route  Sig  Dispense  Refill  . azithromycin (ZITHROMAX Z-PAK) 250 MG tablet      Take 2 tablets (500 mg) on  Day 1,  followed by 1 tablet (250 mg) once daily on Days 2 through 5.   6 each   0   . citalopram (CELEXA) 20 MG tablet   Oral   Take 20 mg by mouth daily.         . predniSONE (DELTASONE) 20 MG tablet   Oral   Take 2 tablets (40 mg total) by mouth daily.   8 tablet   0     Allergies Review of patient's allergies indicates no known allergies.  No family history on file.  Social History Social History  Substance Use Topics  . Smoking status: Former Research scientist (life sciences)  . Smokeless tobacco: None  . Alcohol Use: No    Review of Systems  Constitutional: Negative for fever. Cardiovascular: Negative for chest pain. Respiratory: Positive for shortness of breath. Gastrointestinal: Negative for abdominal pain, vomiting and diarrhea. Genitourinary: Negative for dysuria. Musculoskeletal:  Negative for back pain. Skin: Negative for rash. Neurological: Negative for headaches, focal weakness or numbness.  10-point ROS otherwise negative.  ____________________________________________   PHYSICAL EXAM:  VITAL SIGNS: ED Triage Vitals  Enc Vitals Group     BP 03/14/15 1243 101/71 mmHg     Pulse Rate 03/14/15 1243 50     Resp --      Temp --      Temp src --      SpO2 03/14/15 1243 93 %     Weight 03/14/15 1243 153 lb (69.4 kg)     Height 03/14/15 1243 5\' 7"  (1.702 m)   Constitutional: Alert and oriented. Mild increased respiratory effort.  Eyes: Conjunctivae are normal. PERRL. Normal extraocular movements. ENT   Head: Normocephalic and atraumatic.   Nose: No congestion/rhinnorhea.   Mouth/Throat: Mucous membranes are moist.   Neck: No stridor. Hematological/Lymphatic/Immunilogical: No cervical lymphadenopathy. Cardiovascular: Normal rate, regular rhythm.  No murmurs, rubs, or gallops. Respiratory: Mild increased respiratory effort. Mild bilateral wheezing. No crackles.  Gastrointestinal: Soft and nontender. No distention. There is no CVA tenderness. Genitourinary: Deferred Musculoskeletal: Normal range of motion in all extremities. No joint effusions.  No lower extremity tenderness nor edema. Neurologic:  Normal speech and language. No gross focal neurologic deficits are appreciated. Speech is normal.  Skin:  Skin is warm, dry and intact. No rash noted. Psychiatric: Mood  and affect are normal. Speech and behavior are normal. Patient exhibits appropriate insight and judgment.  ____________________________________________    LABS (pertinent positives/negatives)  Labs Reviewed  CBC WITH DIFFERENTIAL/PLATELET - Abnormal; Notable for the following:    WBC 3.6 (*)    RDW 15.4 (*)    Platelets 102 (*)    All other components within normal limits  BASIC METABOLIC PANEL - Abnormal; Notable for the following:    Glucose, Bld 128 (*)    All other  components within normal limits  TROPONIN I     ____________________________________________   EKG  I, Nance Pear, attending physician, personally viewed and interpreted this EKG  EKG Time: 1249 Rate: 95 Rhythm: sinus rhythm with PAC Axis: left axis deviation Intervals: qtc 485 QRS: RBBB ST changes: no st elevation Impression: abnormal EKG ____________________________________________    RADIOLOGY  CXR IMPRESSION: Lungs mildly hyperexpanded. No edema or consolidation.  ____________________________________________   PROCEDURES  Procedure(s) performed: None  Critical Care performed: No  ____________________________________________   INITIAL IMPRESSION / ASSESSMENT AND PLAN / ED COURSE  Pertinent labs & imaging results that were available during my care of the patient were reviewed by me and considered in my medical decision making (see chart for details).  Patient presented to the emergency department today with history of progressive shortness of breath for the past 4-5 days. On exam patient initially with some very mild increased respiratory effort and wheezing. Patient was given 3 duo nebs as well as Solu-Medrol. After treatment patient stated he was feeling better and breathing easier. Lungs were more open. Chest x-ray did not show any signs of pneumonia, pneumothorax. This point I feel patient is safe for discharge. I did discuss return precautions with the patient.  ____________________________________________   FINAL CLINICAL IMPRESSION(S) / ED DIAGNOSES  Final diagnoses:  COPD exacerbation     Nance Pear, MD 03/14/15 1428

## 2015-03-14 NOTE — ED Notes (Signed)
Says sob getting worse for few days with hx copd.  Says inhaler is not helping enough.  Feels like he is not getting enough oxygen

## 2015-07-12 ENCOUNTER — Emergency Department: Payer: Medicare Other

## 2015-07-12 ENCOUNTER — Encounter: Payer: Self-pay | Admitting: Emergency Medicine

## 2015-07-12 ENCOUNTER — Emergency Department
Admission: EM | Admit: 2015-07-12 | Discharge: 2015-07-12 | Disposition: A | Discharge status: Home / Self Care | Payer: Medicare Other | Attending: Emergency Medicine | Admitting: Emergency Medicine

## 2015-07-12 DIAGNOSIS — J449 Chronic obstructive pulmonary disease, unspecified: Secondary | ICD-10-CM

## 2015-07-12 DIAGNOSIS — Z79899 Other long term (current) drug therapy: Secondary | ICD-10-CM | POA: Insufficient documentation

## 2015-07-12 DIAGNOSIS — J441 Chronic obstructive pulmonary disease with (acute) exacerbation: Secondary | ICD-10-CM | POA: Diagnosis not present

## 2015-07-12 DIAGNOSIS — Z87891 Personal history of nicotine dependence: Secondary | ICD-10-CM | POA: Diagnosis not present

## 2015-07-12 DIAGNOSIS — R0602 Shortness of breath: Secondary | ICD-10-CM | POA: Diagnosis present

## 2015-07-12 LAB — COMPREHENSIVE METABOLIC PANEL
ALBUMIN: 4 g/dL (ref 3.5–5.0)
ALT: 9 U/L — ABNORMAL LOW (ref 17–63)
ANION GAP: 7 (ref 5–15)
AST: 18 U/L (ref 15–41)
Alkaline Phosphatase: 95 U/L (ref 38–126)
BUN: 18 mg/dL (ref 6–20)
CHLORIDE: 111 mmol/L (ref 101–111)
CO2: 25 mmol/L (ref 22–32)
Calcium: 9.7 mg/dL (ref 8.9–10.3)
Creatinine, Ser: 0.77 mg/dL (ref 0.61–1.24)
GFR calc Af Amer: >60 mL/min (ref >60)
GFR calc non Af Amer: >60 mL/min (ref >60)
GLUCOSE: 99 mg/dL (ref 65–99)
POTASSIUM: 4.1 mmol/L (ref 3.5–5.1)
SODIUM: 143 mmol/L (ref 135–145)
TOTAL PROTEIN: 7.8 g/dL (ref 6.5–8.1)
Total Bilirubin: 1 mg/dL (ref 0.3–1.2)

## 2015-07-12 LAB — CBC WITH DIFFERENTIAL/PLATELET
Basophils Absolute: 0 10*3/uL (ref 0–0.1)
Basophils Relative: 1 %
Eosinophils Absolute: 0.4 10*3/uL (ref 0–0.7)
HEMATOCRIT: 49.3 % (ref 40.0–52.0)
Hemoglobin: 15.5 g/dL (ref 13.0–18.0)
LYMPHS ABS: 1.1 10*3/uL (ref 1.0–3.6)
MCH: 27.5 pg (ref 26.0–34.0)
MCHC: 31.4 g/dL — ABNORMAL LOW (ref 32.0–36.0)
MCV: 87.7 fL (ref 80.0–100.0)
Monocytes Absolute: 0.5 10*3/uL (ref 0.2–1.0)
Monocytes Relative: 12 %
NEUTROS ABS: 2.1 10*3/uL (ref 1.4–6.5)
Neutrophils Relative %: 51 %
PLATELETS: 86 10*3/uL — AB (ref 150–440)
RBC: 5.62 MIL/uL (ref 4.40–5.90)
RDW: 15.6 % — ABNORMAL HIGH (ref 11.5–14.5)
WBC: 4.1 10*3/uL (ref 3.8–10.6)

## 2015-07-12 LAB — TROPONIN I: Troponin I: <0.03 ng/mL (ref <0.031)

## 2015-07-12 MED ORDER — PREDNISONE 20 MG PO TABS: 40.0000 mg | ORAL_TABLET | Freq: Every day | ORAL | Status: DC

## 2015-07-12 MED ORDER — PREDNISONE 20 MG PO TABS
40.0000 mg | ORAL_TABLET | Freq: Once | ORAL | Status: AC
  Administered 2015-07-12: 40 mg via ORAL
  Filled 2015-07-12: qty 2

## 2015-07-12 MED ORDER — IPRATROPIUM-ALBUTEROL 0.5-2.5 (3) MG/3ML IN SOLN
3.0000 mL | Freq: Once | RESPIRATORY_TRACT | Status: AC
  Administered 2015-07-12: 3 mL via RESPIRATORY_TRACT
  Filled 2015-07-12: qty 3

## 2015-07-12 MED ORDER — ALBUTEROL SULFATE HFA 108 (90 BASE) MCG/ACT IN AERS: 2.0000 | INHALATION_SPRAY | Freq: Four times a day (QID) | RESPIRATORY_TRACT | Status: DC | PRN

## 2015-07-12 MED ORDER — AZITHROMYCIN 250 MG PO TABS: ORAL_TABLET | ORAL | Status: DC

## 2015-07-12 NOTE — ED Notes (Signed)
Reports sob, states he ran out of his inhaler.  Hx of copd.  Denies cp.  Skin w/d

## 2015-07-12 NOTE — ED Provider Notes (Signed)
[facility] Emergency Department Provider Note  ____________________________________________  Time seen: Approximately 8:29 PM  I have reviewed the triage vital signs and the nursing notes.   HISTORY  Chief Complaint Shortness of Breath    HPI Chris Finley is a 75 y.o. male history of COPD.  Patient reports pain in his normal state of health when he lost his inhalers morning. He usually uses 3-4 times a day, and since running out reports he's been wheezing and feeling mild shortness of breath for about the last 4 hours. Denies any new cough, fevers, chills or chest pain. Reports thathe feels just mild shortness of breath which is normal for him on a fairly daily basis. He has not been sick recently, and is requesting a refill of his inhaler.  He does report having a chronic cough which is unchanged.   Past Medical History  Diagnosis Date  . COPD (chronic obstructive pulmonary disease) ([place])   . Panic attack     There are no active problems to display for this patient.   History reviewed. No pertinent past surgical history.  Current Outpatient Rx  Name  Route  Sig  Dispense  Refill  . albuterol (PROVENTIL HFA;VENTOLIN HFA) 108 (90 Base) MCG/ACT inhaler   Inhalation   Inhale 2 puffs into the lungs every 6 (six) hours as needed for wheezing or shortness of breath.   1 Inhaler   2   . azithromycin (ZITHROMAX Z-PAK) 250 MG tablet      2 tabs on day 1, then 1 tab daily PO for next 4 days   4 each   0   . citalopram (CELEXA) 20 MG tablet   Oral   Take 20 mg by mouth daily.         . Ipratropium-Albuterol (COMBIVENT RESPIMAT) 20-100 MCG/ACT AERS respimat   Inhalation   Inhale 1 puff into the lungs every 6 (six) hours as needed for wheezing or shortness of breath.         . predniSONE (DELTASONE) 20 MG tablet   Oral   Take 2 tablets (40 mg total) by mouth daily with breakfast.   10 tablet   0     Allergies Review of patient's  allergies indicates no known allergies.  History reviewed. No pertinent family history.  Social History Social History  Substance Use Topics  . Smoking status: Former Research scientist (life sciences)  . Smokeless tobacco: None  . Alcohol Use: No    Review of Systems Constitutional: No fever/chills Eyes: No visual changes. ENT: No sore throat. Cardiovascular: Denies chest pain. Respiratory: See history of present illness Gastrointestinal: No abdominal pain.  No nausea, no vomiting.  No diarrhea.  No constipation. Genitourinary: Negative for dysuria. Musculoskeletal: Negative for back pain. Skin: Negative for rash. Neurological: Negative for headaches, focal weakness or numbness.  10-point ROS otherwise negative.  ____________________________________________   PHYSICAL EXAM:  VITAL SIGNS: ED Triage Vitals  Enc Vitals Group     BP 07/12/15 2005 136/94 mmHg     Pulse Rate 07/12/15 2005 82     Resp 07/12/15 2005 18     Temp 07/12/15 1818 98.2 F (36.8 C)     Temp Source 07/12/15 1818 Oral     SpO2 07/12/15 2005 95 %     Weight 07/12/15 1818 153 lb (69.4 kg)     Height 07/12/15 1818 5\' 7"  (1.702 m)     Head Cir --      Peak Flow --  Pain Score 07/12/15 1819 0     Pain Loc --      Pain Edu? --      Excl. in [place]? --    Constitutional: Alert and oriented. Well appearing and in no acute distress. Eyes: Conjunctivae are normal. PERRL. EOMI. Head: Atraumatic. Nose: No congestion/rhinnorhea. Mouth/Throat: Mucous membranes are moist.  Oropharynx non-erythematous. Neck: No stridor.   Cardiovascular: Normal rate, regular rhythm. Grossly normal heart sounds.  Good peripheral circulation. Respiratory: Normal respiratory effort.  No retractions. Mild and diffuse end expiratory wheezing without rales or rhonchi.  Gastrointestinal: Soft and nontender. No distention. No abdominal bruits. No CVA tenderness. Musculoskeletal: No lower extremity tenderness nor edema.  No joint effusions. Neurologic:   Normal speech and language. No gross focal neurologic deficits are appreciated. No gait instability. Skin:  Skin is warm, dry and intact. No rash noted. Psychiatric: Mood and affect are normal. Speech and behavior are normal.  ____________________________________________   LABS (all labs ordered are listed, but only abnormal results are displayed)  Labs Reviewed  CBC WITH DIFFERENTIAL/PLATELET - Abnormal; Notable for the following:    MCHC 31.4 (*)    RDW 15.6 (*)    Platelets 86 (*)    All other components within normal limits  COMPREHENSIVE METABOLIC PANEL - Abnormal; Notable for the following:    ALT 9 (*)    All other components within normal limits  TROPONIN I   ____________________________________________  EKG  Reviewed and interpreted by me at 1830 Ventricular rate 90 Normal sinus rhythm Bifascicular block Right bundle-branch block No acute T wave inversions except those anticipated with a right bundle branch block No ST elevation  Compared with previous EKG from September 17 no significant changes found. ____________________________________________  [id number]  DG Chest 2 View (Final result) Result time: 07/12/15 20:09:01   Final result by Rad Results In Interface (07/12/15 20:09:01)   Narrative:   CLINICAL DATA: Shortness of breath today, ran out of his inhaler medication, coughing, history COPD, former smoker  EXAM: CHEST 2 VIEW  COMPARISON: 03/14/2015  FINDINGS: Normal heart size, mediastinal contours, and pulmonary vascularity.  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  Lungs otherwise clear.  No pulmonary infiltrate, pleural effusion or pneumothorax.  Bones unremarkable.  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  No acute abnormalities.   Electronically Signed By: Lavonia Dana M.D. On: 07/12/2015 20:09     ____________________________________________   PROCEDURES  Procedure(s) performed: None  Critical Care performed: No  ____________________________________________   INITIAL IMPRESSION / ASSESSMENT AND PLAN / ED COURSE  Pertinent labs & imaging results that were available during my care of the patient were reviewed by me and considered in my medical decision making (see chart for details).  Patient presents for evaluation of wheezing. Appears to be fairly stable COPD, but lost his inhaler. 3 prescribe him an albuterol inhaler. Does have mild wheezing, no evidence of acute hypoxia or increased work of breathing however. We'll place him on prednisone as well as azithromycin for concerns of the possibility of a early mild COPD exacerbation at this time. Patient agreeable.  After receiving nebulizer treatment the patient reports his symptoms are much improved. He is awake alert and ambulatory without hypoxia increased work of breathing. EKG is stable with no evidence of new or concerning ischemic abnormality with a negative troponin and no chest pain.  Discharge the patient home, was improved. Patient doing well. Stable. He is going to [facility] to refill  his inhaler prior to their closing 9 PM tonight.  Return precautions and treatment recommendations and follow-up discussed with the patient who is agreeable with the plan.  ____________________________________________   FINAL CLINICAL IMPRESSION(S) / ED DIAGNOSES  Final diagnoses:  COPD, mild (HCC)      Delman Kitten, MD 07/12/15 2329

## 2015-07-12 NOTE — Discharge Instructions (Signed)
We believe that your symptoms are caused today by an exacerbation of your COPD, and possibly bronchitis.  Please take the prescribed medications and any medications that you have at home for your COPD.  Follow up with your doctor as recommended.  If you develop any new or worsening symptoms, including but not limited to fever, persistent vomiting, worsening shortness of breath, or other symptoms that concern you, please return to the Emergency Department immediately. ° ° °Chronic Obstructive Pulmonary Disease Exacerbation °Chronic obstructive pulmonary disease (COPD) is a common lung condition in which airflow from the lungs is limited. COPD is a general term that can be used to describe many different lung problems that limit airflow, including chronic bronchitis and emphysema. COPD exacerbations are episodes when breathing symptoms become much worse and require extra treatment. Without treatment, COPD exacerbations can be life threatening, and frequent COPD exacerbations can cause further damage to your lungs. °CAUSES °· Respiratory infections. °· Exposure to smoke. °· Exposure to air pollution, chemical fumes, or dust. °Sometimes there is no apparent cause or trigger. °RISK FACTORS °· Smoking cigarettes. °· Older age. °· Frequent prior COPD exacerbations. °SIGNS AND SYMPTOMS °· Increased coughing. °· Increased thick spit (sputum) production. °· Increased wheezing. °· Increased shortness of breath. °· Rapid breathing. °· Chest tightness. °DIAGNOSIS °Your medical history, a physical exam, and tests will help your health care provider make a diagnosis. Tests may include: °· A chest X-ray. °· Basic lab tests. °· Sputum testing. °· An arterial blood gas test. °TREATMENT °Depending on the severity of your COPD exacerbation, you may need to be admitted to a hospital for treatment. Some of the treatments commonly used to treat COPD exacerbations are:  °· Antibiotic medicines. °· Bronchodilators. These are drugs that  expand the air passages. They may be given with an inhaler or nebulizer. Spacer devices may be needed to help improve drug delivery. °· Corticosteroid medicines. °· Supplemental oxygen therapy. °· Airway clearing techniques, such as noninvasive ventilation (NIV) and positive expiratory pressure (PEP). These provide respiratory support through a mask or other noninvasive device. °HOME CARE INSTRUCTIONS °· Do not smoke. Quitting smoking is very important to prevent COPD from getting worse and exacerbations from happening as often. °· Avoid exposure to all substances that irritate the airway, especially to tobacco smoke. °· If you were prescribed an antibiotic medicine, finish it all even if you start to feel better. °· Take all medicines as directed by your health care provider. It is important to use correct technique with inhaled medicines. °· Drink enough fluids to keep your urine clear or pale yellow (unless you have a medical condition that requires fluid restriction). °· Use a cool mist vaporizer. This makes it easier to clear your chest when you cough. °· If you have a home nebulizer and oxygen, continue to use them as directed. °· Maintain all necessary vaccinations to prevent infections. °· Exercise regularly. °· Eat a healthy diet. °· Keep all follow-up appointments as directed by your health care provider. °SEEK IMMEDIATE MEDICAL CARE IF: °· You have worsening shortness of breath. °· You have trouble talking. °· You have severe chest pain. °· You have blood in your sputum. °· You have a fever. °· You have weakness, vomit repeatedly, or faint. °· You feel confused. °· You continue to get worse. °MAKE SURE YOU: °· Understand these instructions. °· Will watch your condition. °· Will get help right away if you are not doing well or get worse. °  °This information is not   intended to replace advice given to you by your health care provider. Make sure you discuss any questions you have with your health care  provider. °  °Document Released: 04/11/2007 Document Revised: 07/05/2014 Document Reviewed: 02/16/2013 °Elsevier Interactive Patient Education ©2016 [employer] Inc[employer] ° °

## 2015-10-16 ENCOUNTER — Encounter: Payer: Self-pay | Admitting: Emergency Medicine

## 2015-10-16 ENCOUNTER — Emergency Department
Admission: EM | Admit: 2015-10-16 | Discharge: 2015-10-16 | Disposition: A | Discharge status: Home / Self Care | Payer: Medicare Other | Attending: Emergency Medicine | Admitting: Emergency Medicine

## 2015-10-16 DIAGNOSIS — Z87891 Personal history of nicotine dependence: Secondary | ICD-10-CM | POA: Diagnosis not present

## 2015-10-16 DIAGNOSIS — J449 Chronic obstructive pulmonary disease, unspecified: Secondary | ICD-10-CM | POA: Diagnosis not present

## 2015-10-16 DIAGNOSIS — B001 Herpesviral vesicular dermatitis: Secondary | ICD-10-CM

## 2015-10-16 DIAGNOSIS — K1379 Other lesions of oral mucosa: Secondary | ICD-10-CM | POA: Diagnosis present

## 2015-10-16 MED ORDER — VALACYCLOVIR HCL 1 G PO TABS: 2000.0000 mg | ORAL_TABLET | Freq: Two times a day (BID) | ORAL | Status: AC

## 2015-10-16 NOTE — ED Provider Notes (Signed)
CSN: [id number]     Arrival date & time 10/16/15  1146 History   First MD Initiated Contact with Patient 10/16/15 1234     Chief Complaint  Patient presents with  . Oral Swelling    HPI   75 year old male who presents to the emergency department for evaluation of upper lip swelling. He states he has had similar symptoms in the past and believes this is related to a cold sore. He noticed some tingling and swelling 2 days ago, but is worse today. He has not taken any oral medications nor used any topical ointments.  Past Medical History  Diagnosis Date  . COPD (chronic obstructive pulmonary disease) ([place])   . Panic attack    History reviewed. No pertinent past surgical history. No family history on file. Social History  Substance Use Topics  . Smoking status: Former Research scientist (life sciences)  . Smokeless tobacco: None  . Alcohol Use: No    Review of Systems  Constitutional: Negative for fever.  HENT: Positive for mouth sores. Negative for dental problem.   Respiratory: Negative for shortness of breath.   Skin: Positive for rash.      Allergies  Review of patient's allergies indicates no known allergies.  Home Medications   Prior to Admission medications   Medication Sig Start Date End Date Taking? Authorizing Provider  albuterol (PROVENTIL HFA;VENTOLIN HFA) 108 (90 Base) MCG/ACT inhaler Inhale 2 puffs into the lungs every 6 (six) hours as needed for wheezing or shortness of breath. 07/12/15   Delman Kitten, MD  azithromycin (ZITHROMAX Z-PAK) 250 MG tablet 2 tabs on day 1, then 1 tab daily PO for next 4 days 07/12/15   Delman Kitten, MD  citalopram (CELEXA) 20 MG tablet Take 20 mg by mouth daily.    Historical Provider, MD  Ipratropium-Albuterol (COMBIVENT RESPIMAT) 20-100 MCG/ACT AERS respimat Inhale 1 puff into the lungs every 6 (six) hours as needed for wheezing or shortness of breath.    Historical Provider, MD  predniSONE (DELTASONE) 20 MG tablet Take 2 tablets (40 mg total) by mouth daily with  breakfast. 07/12/15   Delman Kitten, MD  valACYclovir (VALTREX) 1000 MG tablet Take 2 tablets (2,000 mg total) by mouth 2 (two) times daily. 10/16/15 10/18/15  Oday Ridings B Lamere Lightner, FNP   BP 128/71 mmHg  Pulse 94  Temp(Src) 98.2 F (36.8 C) (Oral)  Resp 18  Ht 5\' 7"  (1.702 m)  Wt 69.4 kg  BMI 23.96 kg/m2  SpO2 94% Physical Exam  Constitutional: He is oriented to person, place, and time. He appears well-developed and well-nourished.  HENT:  Mouth/Throat: Uvula is midline and oropharynx is clear and moist.    Eyes: Conjunctivae and EOM are normal.  Neck: Normal range of motion.  Pulmonary/Chest: Effort normal.  Musculoskeletal: Normal range of motion.  Neurological: He is alert and oriented to person, place, and time.  Skin: Skin is warm and dry.  Psychiatric: He has a normal mood and affect. His behavior is normal. Judgment and thought content normal.  Nursing note and vitals reviewed.   ED Course  Procedures (including critical care time) Labs Review Labs Reviewed - No data to display  Imaging Review No results found. I have personally reviewed and evaluated these images and lab results as part of my medical decision-making.   EKG Interpretation None      MDM   Final diagnoses:  Herpes simplex labialis   Patient will be given Valtrex rx. He is to follow up with his  PCP for symptoms that do not improve, change, or worsen. He will return to the ER for any of the above if he is unable to schedule an appointment.    Victorino Dike, FNP 10/18/15 1718  Delman Kitten, MD 10/23/15 [phone]

## 2015-10-16 NOTE — Discharge Instructions (Signed)
Cold Sore °A cold sore (fever blister) is a skin infection caused by a certain type of germ (virus). They are small sores filled with fluid that dry up and heal within 2 weeks. Cold sores form inside of the mouth or on the lips, gums, and other parts of the body. Cold sores can be easily passed (contagious) to other people. This can happen through close personal contact, such as kissing or sharing a drinking glass. °HOME CARE °· Only take medicine as told by your doctor. Do not use aspirin. °· Use a cotton-tip swab to put creams or gels on your sores. °· Do not touch sores or pick scabs. Wash your hands often. Do not touch your eyes without washing your hands first. °· Avoid kissing, oral sex, and sharing personal items until the sores heal. °· Put an ice pack on your sores for 10-15 minutes to ease discomfort. °· Avoid hot, cold, or salty foods. Eat a soft, bland diet. Use a straw to drink if it helps lessen pain. °· Keep sores clean and dry. °· Avoid the sun and limit stress if these things cause you to have sores. Apply sunscreen on your lips if the sun causes cold sores. °GET HELP IF: °· You have a fever or lasting symptoms for more than 2-3 days. °· You have a fever and your symptoms suddenly get worse. °· You have yellow-white fluid (not clear) coming from the sores. °· You have redness that is spreading. °· You have pain or irritation in your eye. °· You get sores on your genitals. °· Your sores do not heal within 2 weeks. °· You have a tough time fighting off sickness and infections (weakened immune system). °· You get cold sores often. °MAKE SURE YOU:  °· Understand these instructions. °· Will watch your condition. °· Will get help right away if you are not doing well or get worse. °  °This information is not intended to replace advice given to you by your health care provider. Make sure you discuss any questions you have with your health care provider. °  °Document Released: 12/14/2011 Document Reviewed:  12/14/2011 °Elsevier Interactive Patient Education ©2016 [employer]vier Inc. ° °

## 2015-10-16 NOTE — ED Notes (Signed)
Pt presents to ED with complaints of lip swelling to upper left upper lip.  Lip red, swollen; pt reports "this is an herbal thing" and it occurs every couple of years.  Pt denies any sob, denies any trouble swallowing.

## 2015-10-16 NOTE — ED Notes (Signed)
Pt here with c/o upper lip swelling from cold sore, states this happens once every 2 or 3 years, states it is a herpes flare up. No issues with sob or throat swelling.

## 2016-03-27 ENCOUNTER — Emergency Department
Admission: EM | Admit: 2016-03-27 | Discharge: 2016-03-27 | Disposition: A | Discharge status: Home / Self Care | Payer: Medicare Other | Attending: Student | Admitting: Student

## 2016-03-27 ENCOUNTER — Emergency Department: Payer: Medicare Other

## 2016-03-27 ENCOUNTER — Encounter: Payer: Self-pay | Admitting: Emergency Medicine

## 2016-03-27 DIAGNOSIS — M25511 Pain in right shoulder: Secondary | ICD-10-CM

## 2016-03-27 DIAGNOSIS — J449 Chronic obstructive pulmonary disease, unspecified: Secondary | ICD-10-CM | POA: Insufficient documentation

## 2016-03-27 DIAGNOSIS — Z79899 Other long term (current) drug therapy: Secondary | ICD-10-CM | POA: Insufficient documentation

## 2016-03-27 DIAGNOSIS — M19011 Primary osteoarthritis, right shoulder: Secondary | ICD-10-CM | POA: Insufficient documentation

## 2016-03-27 DIAGNOSIS — Z87891 Personal history of nicotine dependence: Secondary | ICD-10-CM | POA: Insufficient documentation

## 2016-03-27 DIAGNOSIS — D179 Benign lipomatous neoplasm, unspecified: Secondary | ICD-10-CM | POA: Insufficient documentation

## 2016-03-27 MED ORDER — METHOCARBAMOL 500 MG PO TABS: 500.0000 mg | ORAL_TABLET | Freq: Four times a day (QID) | ORAL | 0 refills | Status: AC | PRN

## 2016-03-27 MED ORDER — NAPROXEN 500 MG PO TABS: 500.0000 mg | ORAL_TABLET | Freq: Two times a day (BID) | ORAL | 0 refills | Status: AC

## 2016-03-27 NOTE — ED Notes (Signed)
See triage note having pain to right arm on and off for about 5 days  Denies any trauma states he thinks the pain is coming from the lumps to arms   States the areas have been there most of life

## 2016-03-27 NOTE — ED Provider Notes (Signed)
[facility] Emergency Department Provider Note   ____________________________________________   First MD Initiated Contact with Patient 03/27/16 1215     (approximate)  I have reviewed the triage vital signs and the nursing notes.   HISTORY  Chief Complaint Arm Pain    HPI Chris Finley is a 75 y.o. 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 Patient presents today with complaints of increased shoulder pain and difficulty moving. He recent change or trauma. Denies any specific painful nodule.   Past Medical History:  Diagnosis Date  . COPD (chronic obstructive pulmonary disease) ([place])   . Panic attack     There are no active problems to display for this patient.   History reviewed. No pertinent surgical history.  Prior to Admission medications   Medication Sig Start Date End Date Taking? Authorizing Provider  albuterol (PROVENTIL HFA;VENTOLIN HFA) 108 (90 Base) MCG/ACT inhaler Inhale 2 puffs into the lungs every 6 (six) hours as needed for wheezing or shortness of breath. 07/12/15   Delman Kitten, MD  citalopram (CELEXA) 20 MG tablet Take 20 mg by mouth daily.    Historical Provider, MD  Ipratropium-Albuterol (COMBIVENT RESPIMAT) 20-100 MCG/ACT AERS respimat Inhale 1 puff into the lungs every 6 (six) hours as needed for wheezing or shortness of breath.    Historical Provider, MD  methocarbamol (ROBAXIN) 500 MG tablet Take 1 tablet (500 mg total) by mouth every 6 (six) hours as needed for muscle spasms. 03/27/16   Arlyss Repress, PA-C  naproxen (NAPROSYN) 500 MG tablet Take 1 tablet (500 mg total) by mouth 2 (two) times daily with a meal. 03/27/16   Arlyss Repress, PA-C    Allergies Review of patient's allergies indicates no known allergies.  No family history on file.  Social History Social History  Substance Use Topics  . Smoking status: Former Research scientist (life sciences)  . Smokeless tobacco: Not on file  . Alcohol use No     Review of Systems Constitutional: No fever/chills Cardiovascular: Denies chest pain. Respiratory: Denies shortness of breath. Musculoskeletal: Positive for right shoulder pain. Skin: Positive for lipomas. Neurological: Negative for headaches, focal weakness or numbness.  10-point ROS otherwise negative.  ____________________________________________   PHYSICAL EXAM:  VITAL SIGNS: ED Triage Vitals  Enc Vitals Group     BP 03/27/16 1201 104/69     Pulse Rate 03/27/16 1201 84     Resp 03/27/16 1201 18     Temp 03/27/16 1201 97.5 F (36.4 C)     Temp Source 03/27/16 1201 Oral     SpO2 03/27/16 1201 94 %     Weight 03/27/16 1201 153 lb (69.4 kg)     Height 03/27/16 1201 5\' 7"  (1.702 m)     Head Circumference --      Peak Flow --      Pain Score 03/27/16 1202 9     Pain Loc --      Pain Edu? --      Excl. in [place]? --     Constitutional: Alert and oriented. Well appearing and in no acute distress. Neck: No stridor.  Supple, full range of motion, nontender. Cardiovascular: Normal rate, regular rhythm. Grossly normal heart sounds.  Good peripheral circulation. Respiratory: Normal respiratory effort.  No retractions. Lungs CTAB. Gastrointestinal: Soft and nontender. No distention. No abdominal bruits. No CVA tenderness. Musculoskeletal: Point tenderness no right shoulder. Full range of motion noted ecchymosis or bruising noted. Neurologic:  Normal  speech and language. No gross focal neurologic deficits are appreciated.  Skin:  Skin is warm, dry and intact. No rash noted. Multiple areas of lipomas noted on the forearm. Approximately 2 cm wide by 1 cm deep. Psychiatric: Mood and affect are normal. Speech and behavior are normal.  ____________________________________________   LABS (all labs ordered are listed, but only abnormal results are displayed)  Labs Reviewed - No data to  display ____________________________________________  EKG   ____________________________________________  RADIOLOGY  IMPRESSION:  1. No acute radiographic abnormality of the right shoulder.  2. 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    ____________________________________________   PROCEDURES  Procedure(s) performed: None  Procedures  Critical Care performed: No  ____________________________________________   INITIAL IMPRESSION / ASSESSMENT AND PLAN / ED COURSE  Pertinent labs & imaging results that were available during my care of the patient were reviewed by me and considered in my medical decision making (see chart for details).  Mild osteoarthritis of the right shoulder. Rx given for Naprosyn 500 mg twice a day and Robaxin 500 4 times a day as needed to relax the muscles in the shoulder. Patient is encouraged to follow up with PCP, which is currently the [place] in [place], or dermatology for the multiple lipomas on his arm.  Clinical Course     ____________________________________________   FINAL CLINICAL IMPRESSION(S) / ED DIAGNOSES  Final diagnoses:  Right shoulder pain  Osteoarthritis of right shoulder, unspecified osteoarthritis type      NEW MEDICATIONS STARTED DURING THIS VISIT:  New Prescriptions   METHOCARBAMOL (ROBAXIN) 500 MG TABLET    Take 1 tablet (500 mg total) by mouth every 6 (six) hours as needed for muscle spasms.   NAPROXEN (NAPROSYN) 500 MG TABLET    Take 1 tablet (500 mg total) by mouth 2 (two) times daily with a meal.     Note:  This document was prepared using Dragon voice recognition software and may include unintentional dictation errors.   Arlyss Repress, PA-C 03/27/16 1313    Joanne Gavel, MD 03/27/16 [phone]

## 2016-03-27 NOTE — ED Triage Notes (Signed)
States R arm is sore for 5 days. Denies injury. Has multiple lumps under skin both arms which he at first thinks is related but he states he has had these "all my life" and these spots are not specifically painful.

## 2016-03-27 NOTE — ED Notes (Signed)
Pt verbalized understanding of discharge instructions. NAD at this time. 

## 2017-02-06 ENCOUNTER — Emergency Department
Admission: EM | Admit: 2017-02-06 | Discharge: 2017-02-06 | Disposition: A | Discharge status: Home / Self Care | Payer: MEDICARE | Attending: Emergency Medicine | Admitting: Emergency Medicine

## 2017-02-06 ENCOUNTER — Emergency Department: Payer: MEDICARE

## 2017-02-06 DIAGNOSIS — J449 Chronic obstructive pulmonary disease, unspecified: Secondary | ICD-10-CM | POA: Insufficient documentation

## 2017-02-06 DIAGNOSIS — Z79899 Other long term (current) drug therapy: Secondary | ICD-10-CM | POA: Diagnosis not present

## 2017-02-06 DIAGNOSIS — J441 Chronic obstructive pulmonary disease with (acute) exacerbation: Secondary | ICD-10-CM | POA: Diagnosis not present

## 2017-02-06 DIAGNOSIS — R0602 Shortness of breath: Secondary | ICD-10-CM | POA: Diagnosis present

## 2017-02-06 DIAGNOSIS — Z87891 Personal history of nicotine dependence: Secondary | ICD-10-CM | POA: Diagnosis not present

## 2017-02-06 LAB — CBC WITH DIFFERENTIAL/PLATELET
BASOS PCT: 0 %
Basophils Absolute: 0 10*3/uL (ref 0–0.1)
EOS ABS: 0 10*3/uL (ref 0–0.7)
Eosinophils Relative: 0 %
HCT: 44.9 % (ref 40.0–52.0)
HEMOGLOBIN: 14.9 g/dL (ref 13.0–18.0)
Lymphocytes Relative: 6 %
Lymphs Abs: 0.4 10*3/uL — ABNORMAL LOW (ref 1.0–3.6)
MCH: 28.6 pg (ref 26.0–34.0)
MCHC: 33.2 g/dL (ref 32.0–36.0)
MCV: 86 fL (ref 80.0–100.0)
Monocytes Absolute: 0.6 10*3/uL (ref 0.2–1.0)
Monocytes Relative: 8 %
NEUTROS PCT: 86 %
Neutro Abs: 6.3 10*3/uL (ref 1.4–6.5)
PLATELETS: 87 10*3/uL — AB (ref 150–440)
RBC: 5.22 MIL/uL (ref 4.40–5.90)
RDW: 15.7 % — ABNORMAL HIGH (ref 11.5–14.5)
WBC: 7.3 10*3/uL (ref 3.8–10.6)

## 2017-02-06 LAB — COMPREHENSIVE METABOLIC PANEL
ALK PHOS: 105 U/L (ref 38–126)
ALT: 14 U/L — AB (ref 17–63)
AST: 29 U/L (ref 15–41)
Albumin: 4.1 g/dL (ref 3.5–5.0)
Anion gap: 12 (ref 5–15)
BILIRUBIN TOTAL: 1.1 mg/dL (ref 0.3–1.2)
BUN: 18 mg/dL (ref 6–20)
CALCIUM: 9.7 mg/dL (ref 8.9–10.3)
CO2: 25 mmol/L (ref 22–32)
CREATININE: 1.09 mg/dL (ref 0.61–1.24)
Chloride: 106 mmol/L (ref 101–111)
Glucose, Bld: 148 mg/dL — ABNORMAL HIGH (ref 65–99)
Potassium: 4.2 mmol/L (ref 3.5–5.1)
Sodium: 143 mmol/L (ref 135–145)
Total Protein: 8.7 g/dL — ABNORMAL HIGH (ref 6.5–8.1)

## 2017-02-06 LAB — TROPONIN I: TROPONIN I: 0.04 ng/mL — AB (ref <0.03)

## 2017-02-06 MED ORDER — AZITHROMYCIN 250 MG PO TABS: ORAL_TABLET | ORAL | 0 refills | Status: AC

## 2017-02-06 MED ORDER — SPACER/AERO CHAMBER MOUTHPIECE MISC: 1.0000 [IU] | 0 refills | Status: AC | PRN

## 2017-02-06 MED ORDER — IPRATROPIUM-ALBUTEROL 0.5-2.5 (3) MG/3ML IN SOLN
3.0000 mL | Freq: Once | RESPIRATORY_TRACT | Status: AC
  Administered 2017-02-06: 3 mL via RESPIRATORY_TRACT
  Filled 2017-02-06: qty 3

## 2017-02-06 MED ORDER — PREDNISONE 10 MG PO TABS: 50.0000 mg | ORAL_TABLET | Freq: Every day | ORAL | 0 refills | Status: AC

## 2017-02-06 MED ORDER — MAGNESIUM SULFATE 2 GM/50ML IV SOLN
2.0000 g | Freq: Once | INTRAVENOUS | Status: AC
  Administered 2017-02-06: 2 g via INTRAVENOUS
  Filled 2017-02-06: qty 50

## 2017-02-06 MED ORDER — SODIUM CHLORIDE 0.9 % IV BOLUS (SEPSIS)
1000.0000 mL | Freq: Once | INTRAVENOUS | Status: AC
  Administered 2017-02-06: 1000 mL via INTRAVENOUS

## 2017-02-06 MED ORDER — ALBUTEROL SULFATE HFA 108 (90 BASE) MCG/ACT IN AERS: 2.0000 | INHALATION_SPRAY | Freq: Four times a day (QID) | RESPIRATORY_TRACT | 2 refills | Status: AC | PRN

## 2017-02-06 MED ORDER — METHYLPREDNISOLONE SODIUM SUCC 125 MG IJ SOLR
125.0000 mg | Freq: Once | INTRAMUSCULAR | Status: AC
  Administered 2017-02-06: 125 mg via INTRAVENOUS
  Filled 2017-02-06: qty 2

## 2017-02-06 MED ORDER — IPRATROPIUM-ALBUTEROL 0.5-2.5 (3) MG/3ML IN SOLN
RESPIRATORY_TRACT | Status: AC
  Filled 2017-02-06: qty 6

## 2017-02-06 MED ORDER — ALBUTEROL SULFATE (2.5 MG/3ML) 0.083% IN NEBU
5.0000 mg | INHALATION_SOLUTION | Freq: Once | RESPIRATORY_TRACT | Status: AC
  Administered 2017-02-06: 5 mg via RESPIRATORY_TRACT
  Filled 2017-02-06: qty 6

## 2017-02-06 NOTE — ED Notes (Signed)
Patient transported to X-ray 

## 2017-02-06 NOTE — ED Notes (Signed)
States feeling better after breathing treatments.

## 2017-02-06 NOTE — ED Triage Notes (Signed)
Pt reports last night he started having some sob, pt states that he has been wheezing and used his inhaler without relief this am, pt states that he can feel the rattiling in his chest

## 2017-02-06 NOTE — ED Notes (Addendum)
Pt report feeling better.  sinus tach on monitor.  Pt alert.

## 2017-02-06 NOTE — ED Notes (Signed)
Iv started and meds given.  Fluids infusing.  Breathing treatments given to pt.

## 2017-02-06 NOTE — ED Provider Notes (Signed)
[facility] Emergency Department Provider Note  ____________________________________________   First MD Initiated Contact with Patient 02/06/17 1551     (approximate)  I have reviewed the triage vital signs and the nursing notes.   HISTORY  Chief Complaint Shortness of Breath   HPI Chris Finley is a 76 y.o. male who self presents to the emergency department with moderate to severe insidious onset shortness of breath that began last night. He has a past medical history of COPD for which she uses an inhaler. He has been using his inhaler with minimal relief. He has never been intubated. He does not use home oxygen. He has had slight increase in productive cough recently. He does have sharp moderate severity diffuse chest pain worse when coughing improved when not coughing.   Past Medical History:  Diagnosis Date  . COPD (chronic obstructive pulmonary disease) ([place])   . Panic attack     There are no active problems to display for this patient.   No past surgical history on file.  Prior to Admission medications   Medication Sig Start Date End Date Taking? Authorizing Provider  albuterol (PROVENTIL HFA;VENTOLIN HFA) 108 (90 Base) MCG/ACT inhaler Inhale 2 puffs into the lungs every 6 (six) hours as needed for wheezing or shortness of breath. 02/06/17   Darel Hong, MD  azithromycin (ZITHROMAX Z-PAK) 250 MG tablet Take 2 tablets (500 mg) on  Day 1,  followed by 1 tablet (250 mg) once daily on Days 2 through 5. 02/06/17 02/11/17  Darel Hong, MD  citalopram (CELEXA) 20 MG tablet Take 20 mg by mouth daily.    [provider]  Ipratropium-Albuterol (COMBIVENT RESPIMAT) 20-100 MCG/ACT AERS respimat Inhale 1 puff into the lungs every 6 (six) hours as needed for wheezing or shortness of breath.    [provider]  methocarbamol (ROBAXIN) 500 MG tablet Take 1 tablet (500 mg total) by mouth every 6 (six) hours as needed for muscle spasms.  03/27/16   Beers, Pierce Crane, PA-C  naproxen (NAPROSYN) 500 MG tablet Take 1 tablet (500 mg total) by mouth 2 (two) times daily with a meal. 03/27/16   Beers, Pierce Crane, PA-C  predniSONE (DELTASONE) 10 MG tablet Take 5 tablets (50 mg total) by mouth daily. 02/06/17 02/10/17  Darel Hong, MD  Spacer/Aero Chamber Mouthpiece MISC 1 Units by Does not apply route every 4 (four) hours as needed (wheezing). 02/06/17   Darel Hong, MD    Allergies Patient has no known allergies.  No family history on file.  Social History Social History  Substance Use Topics  . Smoking status: Former Research scientist (life sciences)  . Smokeless tobacco: Not on file  . Alcohol use No    Review of Systems Constitutional: No fever/chills Eyes: No visual changes. ENT: No sore throat. Cardiovascular: Positive chest pain. Respiratory: Positive shortness of breath. Gastrointestinal: No abdominal pain.  No nausea, no vomiting.  No diarrhea.  No constipation. Genitourinary: Negative for dysuria. Musculoskeletal: Negative for back pain. Skin: Negative for rash. Neurological: Negative for headaches, focal weakness or numbness.   ____________________________________________   PHYSICAL EXAM:  VITAL SIGNS: ED Triage Vitals  Enc Vitals Group     BP 02/06/17 1547 112/72     Pulse Rate 02/06/17 1547 (!) 123     Resp 02/06/17 1547 (!) 24     Temp 02/06/17 1547 98 F (36.7 C)     Temp Source 02/06/17 1547 Oral     SpO2 02/06/17 1547 (!) 86 %  Weight 02/06/17 1548 150 lb (68 kg)     Height 02/06/17 1548 5\' 7"  (1.702 m)     Head Circumference --      Peak Flow --      Pain Score --      Pain Loc --      Pain Edu? --      Excl. in [place]? --     Constitutional: Alert and oriented 4 moderate respiratory distress using accessory muscles speaking in short sentences with audible wheeze Eyes: PERRL EOMI. Head: Atraumatic. Nose: No congestion/rhinnorhea. Mouth/Throat: No trismus Neck: No stridor.   Cardiovascular: Tachycardic  rate, regular rhythm. Grossly normal heart sounds.  Good peripheral circulation. Respiratory: Moderate respiratory distress audible wheeze moving limited amounts of air Gastrointestinal: Soft nontender Musculoskeletal: No lower extremity edema  legs are equal in size Neurologic:  Normal speech and language. No gross focal neurologic deficits are appreciated. Skin:  Skin is warm, dry and intact. No rash noted. Psychiatric: Mood and affect are normal. Speech and behavior are normal.    ____________________________________________   DIFFERENTIAL includes but not limited to  COPD, asthma, pneumothorax, pneumonia, pulmonary moves and, acute coronary syndrome ____________________________________________   LABS (all labs ordered are listed, but only abnormal results are displayed)  Labs Reviewed  COMPREHENSIVE METABOLIC PANEL - Abnormal; Notable for the following:       Result Value   Glucose, Bld 148 (*)    Total Protein 8.7 (*)    ALT 14 (*)    All other components within normal limits  CBC WITH DIFFERENTIAL/PLATELET - Abnormal; Notable for the following:    RDW 15.7 (*)    Platelets 87 (*)    Lymphs Abs 0.4 (*)    All other components within normal limits  TROPONIN I - Abnormal; Notable for the following:    Troponin I 0.04 (*)    All other components within normal limits    Slightly elevated troponin likely secondary to demand and not primary cardiac ischemia __________________________________________  EKG  ED ECG REPORT I, Darel Hong, the attending physician, personally viewed and interpreted this ECG.  Date: 02/06/2017 Rate: 122 Rhythm: Sinus tachycardia QRS Axis: normal Intervals: Prolonged QTC ST/T Wave abnormalities: normal Narrative Interpretation: Bifascicular block with no signs of acute ischemia appreciated computer is reading this as an acute myocardial infarction but I disagree _________________________________________  RADIOLOGY  Chest x-ray with  no infiltrate noted ____________________________________________   PROCEDURES  Procedure(s) performed: no  Procedures  Critical Care performed: yes  CRITICAL CARE Performed by: Darel Hong   Total critical care time: 35 minutes  Critical care time was exclusive of separately billable procedures and treating other patients.  Critical care was necessary to treat or prevent imminent or life-threatening deterioration.  Critical care was time spent personally by me on the following activities: development of treatment plan with patient and/or surrogate as well as nursing, discussions with consultants, evaluation of patient's response to treatment, examination of patient, obtaining history from patient or surrogate, ordering and performing treatments and interventions, ordering and review of laboratory studies, ordering and review of radiographic studies, pulse oximetry and re-evaluation of patient's condition.   Observation: no ____________________________________________   INITIAL IMPRESSION / ASSESSMENT AND PLAN / ED COURSE  Pertinent labs & imaging results that were available during my care of the patient were reviewed by me and considered in my medical decision making (see chart for details).  The patient arrives tachycardic to Neck and hypoxic on room air to 86%. His  lungs are quite tight. Favor COPD exacerbation with dehydration. 3 duo nebs magnesium and Solu-Medrol now.    ----------------------------------------- 5:47 PM on 02/06/2017 -----------------------------------------  The patient is moving more air than before however he is now saturating 87% on room air. I recommended inpatient admission however the patient declined stating he wanted to go home. He consents 2 more breathing treatments prior to discharge.   ----------------------------------------- 6:27 PM on 02/06/2017 -----------------------------------------  The patient is now saturating 86% on room  air although he is clearly clinically improved. I have recommended inpatient admission however he declined stating he would like to go home. He is alert and oriented 4 and has capacity to make medical decisions. He understands he is leaving the hospital [place] and he feels welcome to return at any point and he knows that we are more than happy to continue his care. ____________________________________________   FINAL CLINICAL IMPRESSION(S) / ED DIAGNOSES  Final diagnoses:  COPD exacerbation ([place])      NEW MEDICATIONS STARTED DURING THIS VISIT:  New Prescriptions   AZITHROMYCIN (ZITHROMAX Z-PAK) 250 MG TABLET    Take 2 tablets (500 mg) on  Day 1,  followed by 1 tablet (250 mg) once daily on Days 2 through 5.   PREDNISONE (DELTASONE) 10 MG TABLET    Take 5 tablets (50 mg total) by mouth daily.   SPACER/AERO CHAMBER MOUTHPIECE MISC    1 Units by Does not apply route every 4 (four) hours as needed (wheezing).     Note:  This document was prepared using Dragon voice recognition software and may include unintentional dictation errors.     Darel Hong, MD 02/06/17 1836

## 2017-02-06 NOTE — ED Notes (Addendum)
Pt took iv out himself.  Pt adminant about leaving   Pt signed d/c inst.

## 2017-02-06 NOTE — Discharge Instructions (Signed)
Today you are quite short of breath in your lungs are inflamed. I recommend that you be admitted to the hospital for continued treatment and oxygen to make sure that you're lungs improved. If you change your mind and would like to be admitted to the hospital please come back to our emergency department at any point and we are more than happy to continue your care. If you do not want to be admitted please make sure you see her primary care physician tomorrow for reevaluation.  It was a pleasure to take care of you today, and thank you for coming to our emergency department.  If you have any questions or concerns before leaving please ask the nurse to grab me and I'm more than happy to go through your aftercare instructions again.  If you were prescribed any opioid pain medication today such as Norco, Vicodin, Percocet, morphine, hydrocodone, or oxycodone please make sure you do not drive when you are taking this medication as it can alter your ability to drive safely.  If you have any concerns once you are home that you are not improving or are in fact getting worse before you can make it to your follow-up appointment, please do not hesitate to call 911 and come back for further evaluation.  Darel Hong, MD  Results for orders placed or performed during the hospital encounter of 02/06/17  Comprehensive metabolic panel  Result Value Ref Range   Sodium 143 135 - 145 mmol/L   Potassium 4.2 3.5 - 5.1 mmol/L   Chloride 106 101 - 111 mmol/L   CO2 25 22 - 32 mmol/L   Glucose, Bld 148 (H) 65 - 99 mg/dL   BUN 18 6 - 20 mg/dL   Creatinine, Ser 1.09 0.61 - 1.24 mg/dL   Calcium 9.7 8.9 - 10.3 mg/dL   Total Protein 8.7 (H) 6.5 - 8.1 g/dL   Albumin 4.1 3.5 - 5.0 g/dL   AST 29 15 - 41 U/L   ALT 14 (L) 17 - 63 U/L   Alkaline Phosphatase 105 38 - 126 U/L   Total Bilirubin 1.1 0.3 - 1.2 mg/dL   GFR calc non Af Amer >60 >60 mL/min   GFR calc Af Amer >60 >60 mL/min   Anion gap 12 5 - 15  CBC with  Differential  Result Value Ref Range   WBC 7.3 3.8 - 10.6 K/uL   RBC 5.22 4.40 - 5.90 MIL/uL   Hemoglobin 14.9 13.0 - 18.0 g/dL   HCT 44.9 40.0 - 52.0 %   MCV 86.0 80.0 - 100.0 fL   MCH 28.6 26.0 - 34.0 pg   MCHC 33.2 32.0 - 36.0 g/dL   RDW 15.7 (H) 11.5 - 14.5 %   Platelets 87 (L) 150 - 440 K/uL   Neutrophils Relative % 86 %   Neutro Abs 6.3 1.4 - 6.5 K/uL   Lymphocytes Relative 6 %   Lymphs Abs 0.4 (L) 1.0 - 3.6 K/uL   Monocytes Relative 8 %   Monocytes Absolute 0.6 0.2 - 1.0 K/uL   Eosinophils Relative 0 %   Eosinophils Absolute 0.0 0 - 0.7 K/uL   Basophils Relative 0 %   Basophils Absolute 0.0 0 - 0.1 K/uL  Troponin I  Result Value Ref Range   Troponin I 0.04 (HH) <0.03 ng/mL   Dg Chest 2 View  Result Date: 02/06/2017 CLINICAL DATA:  Shortness of breath.  Wheezing. EXAM: CHEST  2 VIEW COMPARISON:  July 12, 2015 FINDINGS: Hyperinflation  of lungs is stable consistent with COPD or emphysema. The heart, hila, and mediastinum are normal. Probable bulla in the right apex. No nodules, masses, or suspicious infiltrates. IMPRESSION: Hyperinflation of the lungs likely representing COPD or emphysema. No other acute abnormalities. Electronically Signed   By: Dorise Bullion III M.D   On: 02/06/2017 16:55

## 2017-02-06 NOTE — ED Notes (Signed)
FIRST NURSE NOTE:  Pt ambulatory into lobby, arrived POV alone states he has been short of breath all night and has hx of COPD.

## 2020-11-09 ENCOUNTER — Emergency Department
Admission: EM | Admit: 2020-11-09 | Discharge: 2020-11-09 | Discharge status: Against Medical Advice | Payer: Medicare PPO | Attending: Emergency Medicine | Admitting: Emergency Medicine

## 2020-11-09 ENCOUNTER — Other Ambulatory Visit: Payer: Self-pay

## 2020-11-09 ENCOUNTER — Emergency Department: Payer: Medicare PPO

## 2020-11-09 DIAGNOSIS — J449 Chronic obstructive pulmonary disease, unspecified: Secondary | ICD-10-CM | POA: Diagnosis not present

## 2020-11-09 DIAGNOSIS — E875 Hyperkalemia: Secondary | ICD-10-CM | POA: Insufficient documentation

## 2020-11-09 DIAGNOSIS — Z87891 Personal history of nicotine dependence: Secondary | ICD-10-CM | POA: Diagnosis not present

## 2020-11-09 DIAGNOSIS — R531 Weakness: Secondary | ICD-10-CM

## 2020-11-09 LAB — COMPREHENSIVE METABOLIC PANEL
ALT: 7 U/L (ref 0–44)
AST: 30 U/L (ref 15–41)
Albumin: 3.4 g/dL — ABNORMAL LOW (ref 3.5–5.0)
Alkaline Phosphatase: 97 U/L (ref 38–126)
Anion gap: 8 (ref 5–15)
BUN: 17 mg/dL (ref 8–23)
CO2: 26 mmol/L (ref 22–32)
Calcium: 9.4 mg/dL (ref 8.9–10.3)
Chloride: 106 mmol/L (ref 98–111)
Creatinine, Ser: 1.11 mg/dL (ref 0.61–1.24)
GFR, Estimated: >60 mL/min (ref >60)
Glucose, Bld: 102 mg/dL — ABNORMAL HIGH (ref 70–99)
Potassium: 5.9 mmol/L — ABNORMAL HIGH (ref 3.5–5.1)
Sodium: 140 mmol/L (ref 135–145)
Total Bilirubin: 1.2 mg/dL (ref 0.3–1.2)
Total Protein: 7.7 g/dL (ref 6.5–8.1)

## 2020-11-09 LAB — URINALYSIS, COMPLETE (UACMP) WITH MICROSCOPIC
Bilirubin Urine: NEGATIVE
Glucose, UA: NEGATIVE mg/dL
Hgb urine dipstick: NEGATIVE
Ketones, ur: NEGATIVE mg/dL
Leukocytes,Ua: NEGATIVE
Nitrite: NEGATIVE
Protein, ur: NEGATIVE mg/dL
Specific Gravity, Urine: 1.013 (ref 1.005–1.030)
pH: 6 (ref 5.0–8.0)

## 2020-11-09 LAB — CBC
HCT: 42.5 % (ref 39.0–52.0)
Hemoglobin: 14.4 g/dL (ref 13.0–17.0)
MCH: 29.2 pg (ref 26.0–34.0)
MCHC: 33.9 g/dL (ref 30.0–36.0)
MCV: 86.2 fL (ref 80.0–100.0)
Platelets: 106 10*3/uL — ABNORMAL LOW (ref 150–400)
RBC: 4.93 MIL/uL (ref 4.22–5.81)
RDW: 15.2 % (ref 11.5–15.5)
WBC: 6.9 10*3/uL (ref 4.0–10.5)
nRBC: 0 % (ref 0.0–0.2)

## 2020-11-09 LAB — TROPONIN I (HIGH SENSITIVITY): Troponin I (High Sensitivity): 18 ng/L — ABNORMAL HIGH (ref <18)

## 2020-11-09 LAB — MAGNESIUM: Magnesium: 2.1 mg/dL (ref 1.7–2.4)

## 2020-11-09 NOTE — ED Provider Notes (Signed)
[facility] Emergency Department Provider Note  ____________________________________________   Event Date/Time   First MD Initiated Contact with Patient 11/09/20 1009     (approximate)  I have reviewed the triage vital signs and the nursing notes.   HISTORY  Chief Complaint Weakness   HPI Chris Finley is a 80 y.o. male past medical history of COPD and anxiety who presents for assessment of an episode yesterday around 4 -5 AM patient states he could not move for about an hour and had some nausea and vomiting.  He states he has never had any prior similar episodes.  States he felt fine afterwards.  States he came down and usually does not his living room to watch TV and while he was on the couch he felt like he could not move and had vomiting.  He states that friend convinced him to come to get checked on the emergency room today.  He has not had any subsequent nausea or vomiting and did not have any associated headache, earache, sore throat, vertigo, vision changes, chest pain, cough, shortness of breath, abdominal pain, back pain, urinary symptoms, diarrhea and has not had any recent falls or injuries.  He is not on any blood thinners.  States she is still smoking but is working on quitting with nicotine patches.  Denies any recent EtOH or illicit drug use.  States he felt that his whole body could not move at the time and that he did not have any focal weakness.         Past Medical History:  Diagnosis Date  . COPD (chronic obstructive pulmonary disease) ([place])   . Panic attack     There are no problems to display for this patient.   No past surgical history on file.  Prior to Admission medications   Medication Sig Start Date End Date Taking? Authorizing Provider  albuterol (PROVENTIL HFA;VENTOLIN HFA) 108 (90 Base) MCG/ACT inhaler Inhale 2 puffs into the lungs every 6 (six) hours as needed for wheezing or shortness of breath. 02/06/17    Darel Hong, MD  citalopram (CELEXA) 20 MG tablet Take 20 mg by mouth daily.    [provider]  Ipratropium-Albuterol (COMBIVENT RESPIMAT) 20-100 MCG/ACT AERS respimat Inhale 1 puff into the lungs every 6 (six) hours as needed for wheezing or shortness of breath.    [provider]  methocarbamol (ROBAXIN) 500 MG tablet Take 1 tablet (500 mg total) by mouth every 6 (six) hours as needed for muscle spasms. 03/27/16   Beers, Pierce Crane, PA-C  naproxen (NAPROSYN) 500 MG tablet Take 1 tablet (500 mg total) by mouth 2 (two) times daily with a meal. 03/27/16   Beers, Pierce Crane, PA-C  Spacer/Aero Chamber Mouthpiece MISC 1 Units by Does not apply route every 4 (four) hours as needed (wheezing). 02/06/17   Darel Hong, MD    Allergies Patient has no known allergies.  No family history on file.  Social History Social History   Tobacco Use  . Smoking status: Former Smoker  Substance Use Topics  . Alcohol use: No    Review of Systems  Review of Systems  Constitutional: Negative for chills and fever.  HENT: Negative for sore throat.   Eyes: Negative for pain.  Respiratory: Negative for cough and stridor.   Cardiovascular: Negative for chest pain.  Gastrointestinal: Positive for vomiting.  Genitourinary: Negative for dysuria.  Musculoskeletal: Negative for myalgias.  Skin: Negative for rash.  Neurological: Positive for weakness.  Negative for seizures, loss of consciousness and headaches.  Psychiatric/Behavioral: Negative for suicidal ideas.  All other systems reviewed and are negative.     ____________________________________________   PHYSICAL EXAM:  VITAL SIGNS: ED Triage Vitals  Enc Vitals Group     BP      Pulse      Resp      Temp      Temp src      SpO2      Weight      Height      Head Circumference      Peak Flow      Pain Score      Pain Loc      Pain Edu?      Excl. in [place]?    Vitals:   11/09/20 1200 11/09/20 1301  BP: 130/81 (!)  134/92  Pulse: 82 85  Resp: 18 20  Temp:  97.9 F (36.6 C)  SpO2: 96% 95%   Physical Exam Vitals and nursing note reviewed.  Constitutional:      Appearance: He is well-developed.  HENT:     Head: Normocephalic and atraumatic.     Right Ear: External ear normal.     Left Ear: External ear normal.     Nose: Nose normal.     Mouth/Throat:     Mouth: Mucous membranes are moist.  Eyes:     Conjunctiva/sclera: Conjunctivae normal.  Cardiovascular:     Rate and Rhythm: Normal rate and regular rhythm.     Heart sounds: No murmur heard.   Pulmonary:     Effort: Pulmonary effort is normal. No respiratory distress.     Breath sounds: Normal breath sounds.  Abdominal:     Palpations: Abdomen is soft.     Tenderness: There is no abdominal tenderness.  Musculoskeletal:     Cervical back: Neck supple.  Skin:    General: Skin is warm and dry.     Capillary Refill: Capillary refill takes less than 2 seconds.  Neurological:     Mental Status: He is alert and oriented to person, place, and time.  Psychiatric:        Mood and Affect: Mood normal.     Cranial nerves II through XII grossly intact.  No pronator drift.  No finger dysmetria.  Symmetric 5/5 strength of all extremities.  Sensation intact to light touch in all extremities.  Unremarkable unassisted gait.  ____________________________________________   LABS (all labs ordered are listed, but only abnormal results are displayed)  Labs Reviewed  CBC - Abnormal; Notable for the following components:      Result Value   Platelets 106 (*)    All other components within normal limits  URINALYSIS, COMPLETE (UACMP) WITH MICROSCOPIC - Abnormal; Notable for the following components:   Color, Urine YELLOW (*)    APPearance HAZY (*)    Bacteria, UA RARE (*)    All other components within normal limits  COMPREHENSIVE METABOLIC PANEL - Abnormal; Notable for the following components:   Potassium 5.9 (*)    Glucose, Bld 102 (*)     Albumin 3.4 (*)    All other components within normal limits  TROPONIN I (HIGH SENSITIVITY) - Abnormal; Notable for the following components:   Troponin I (High Sensitivity) 18 (*)    All other components within normal limits  MAGNESIUM  POTASSIUM  TROPONIN I (HIGH SENSITIVITY)   ____________________________________________  EKG  Sinus rhythm with ventricular 96, 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 ____________________________________________  RADIOLOGY  ED MD interpretation: Chest x-ray has evidence of chronic bronchitic changes without evidence of focal consolidation, pneumothorax, effusion, edema or any other clear acute intrathoracic process.  CT head shows no clear acute intracranial process though there is evidence of chronic small vessel ischemic changes.  Official radiology report(s): DG Chest 2 View  Result Date: 11/09/2020 CLINICAL DATA:  Nausea and weakness.  Vomiting. EXAM: CHEST - 2 VIEW COMPARISON:  02/06/2017 FINDINGS: Heart size is normal. Lungs are free of focal consolidations and pleural effusions. Emphysematous changes are noted in the apices. There is mild perihilar peribronchial thickening. IMPRESSION: 1. Bronchitic changes. 2.  No evidence for acute  abnormality. Electronically Signed   By: Nolon Nations M.D.   On: 11/09/2020 10:44   CT Head Wo Contrast  Result Date: 11/09/2020 CLINICAL DATA:  Transient ischemic attack EXAM: CT HEAD WITHOUT CONTRAST TECHNIQUE: Contiguous axial images were obtained from the base of the skull through the vertex without intravenous contrast. COMPARISON:  02/16/2007 FINDINGS: Brain: No evidence of acute infarction, hemorrhage, hydrocephalus, extra-axial collection or mass lesion/mass effect. There is mild diffuse low-attenuation within the subcortical and periventricular white matter compatible with chronic microvascular disease. Vascular: No  hyperdense vessel or unexpected calcification. Skull: Normal. Negative for fracture or focal lesion. Sinuses/Orbits: Age-indeterminate appearing fracture deformity involving the medial wall of the right orbit. The paranasal sinuses and mastoid air cells are clear. Other: Right posterior scalp calcifications and thickening noted. IMPRESSION: 1. No acute intracranial abnormality. 2. Chronic small vessel ischemic change. 1. No acute intracranial abnormality. 2. Chronic small vessel ischemic change. Electronically Signed   By: Kerby Moors M.D.   On: 11/09/2020 11:45    ____________________________________________   PROCEDURES  Procedure(s) performed (including Critical Care):  Procedures   ____________________________________________   INITIAL IMPRESSION / ASSESSMENT AND PLAN / ED COURSE        Patient presents with above to history exam for an episode of whole body weakness where patient describes being unable to move at all associate with some vomiting yesterday.  He felt fine subsequently.  No other acute concerns at this time.  On arrival he is afebrile hemodynamically stable.  Nonfocal neuro exam.  Differential includes possible atypical presentation for ACS, arrhythmia, TIA and metabolic derangements.  Chest x-ray has no clear acute intrathoracic abnormalities.  ECG shows several ischemic changes on the right bundle branch block and history of fascicle block are not new when compared to EKG obtained on 2018.  Initial troponin is at the upper limit of normal 18 and while I have a low suspicion for ACS at this time we will plan to obtain a 2-hour delta troponin.  No new arrhythmia identified today.  CBC shows no leukocytosis or acute anemia.  CMP remarkable for K of 5.9 without any other significant electrode or metabolic derangements.  Magnesium is unremarkable.  Given concern for possible TIA MRI ordered as CT head is unremarkable for any acute changes although significant small  vessel chronic disease.  Prior to undergoing MRI or obtaining repeat troponin patient stated he wished to leave.  Advised patient that he may have had a small stroke or TIA and that I am unable to say for certain if he did have a stroke obtain an MRI.  Also advised him that I recommended obtaining repeat troponin to ensure he was not experiencing any evidence of heart attack or cardiac ischemia.  Patient  voiced understanding of both these plans but stated he still wished to leave and that he understood could be missing life-threatening diagnosis.  Discussed elevated potassium at 5.9 patient stated he had been eating 1 banana per day over the last 3 to 4 weeks advised him to discontinue this and have his potassium rechecked in 2 or 3 days by his PCP.  He is amenable this plan.  He was discharged against my advice believe he had capacity make this decision.   ____________________________________________   FINAL CLINICAL IMPRESSION(S) / ED DIAGNOSES  Final diagnoses:  Weakness  Hyperkalemia    Medications - No data to display   ED Discharge Orders    None       Note:  This document was prepared using Dragon voice recognition software and may include unintentional dictation errors.   Lucrezia Starch, MD 11/09/20 1336

## 2020-11-09 NOTE — ED Notes (Signed)
Pt stating he "has been here for 3 hours now and wants to leave." Provider [facility] notified. Getting AMA paperwork together.

## 2020-11-09 NOTE — ED Notes (Signed)
First Nurse Note: Pt states that yesterday he had an episode of vomiting yesterday, after he vomited he was not able to move his body for a hour. Pt is concerned that he had a TIA. Pt ambulatory into ED without difficulty or distress.

## 2020-11-09 NOTE — ED Notes (Signed)
Dr. Tamala Julian had [name] discussion with patient regarding leaving AMA - patient verbalized understanding of risks and is agreeable to sign AMA form (completed with this RN as witness) Pt educated to follow up with PCP as soon as possible for re eval and repeat lab tests. Also informed him to return at [place] for any further concerns. Vitals updated. Pt ambulated out of department without difficulty.

## 2020-11-09 NOTE — ED Triage Notes (Signed)
Pt to ED POV for weakness and vomiting yesterday. States for one hour he could not move his body after he vomited x1.  States he feels weak today, able to move all extremities, ambulatory, alert and oriented, clear speech

## 2021-11-16 ENCOUNTER — Emergency Department
Admission: EM | Admit: 2021-11-16 | Discharge: 2021-11-16 | Disposition: A | Discharge status: Home / Self Care | Payer: Medicare PPO | Attending: Emergency Medicine | Admitting: Emergency Medicine

## 2021-11-16 ENCOUNTER — Other Ambulatory Visit: Payer: Self-pay

## 2021-11-16 ENCOUNTER — Emergency Department: Payer: Medicare PPO

## 2021-11-16 DIAGNOSIS — R4182 Altered mental status, unspecified: Secondary | ICD-10-CM | POA: Diagnosis not present

## 2021-11-16 DIAGNOSIS — R55 Syncope and collapse: Secondary | ICD-10-CM | POA: Diagnosis present

## 2021-11-16 DIAGNOSIS — R32 Unspecified urinary incontinence: Secondary | ICD-10-CM | POA: Insufficient documentation

## 2021-11-16 LAB — CBC
HCT: 45.8 % (ref 39.0–52.0)
Hemoglobin: 14.9 g/dL (ref 13.0–17.0)
MCH: 28.5 pg (ref 26.0–34.0)
MCHC: 32.5 g/dL (ref 30.0–36.0)
MCV: 87.6 fL (ref 80.0–100.0)
Platelets: 99 10*3/uL — ABNORMAL LOW (ref 150–400)
RBC: 5.23 MIL/uL (ref 4.22–5.81)
RDW: 15.5 % (ref 11.5–15.5)
WBC: 3.9 10*3/uL — ABNORMAL LOW (ref 4.0–10.5)
nRBC: 0 % (ref 0.0–0.2)

## 2021-11-16 LAB — URINALYSIS, ROUTINE W REFLEX MICROSCOPIC
Bacteria, UA: NONE SEEN
Bilirubin Urine: NEGATIVE
Glucose, UA: NEGATIVE mg/dL
Hgb urine dipstick: NEGATIVE
Ketones, ur: 20 mg/dL — AB
Leukocytes,Ua: NEGATIVE
Nitrite: NEGATIVE
Protein, ur: 30 mg/dL — AB
Specific Gravity, Urine: 1.013 (ref 1.005–1.030)
pH: 6 (ref 5.0–8.0)

## 2021-11-16 LAB — COMPREHENSIVE METABOLIC PANEL
ALT: 11 U/L (ref 0–44)
AST: 23 U/L (ref 15–41)
Albumin: 4 g/dL (ref 3.5–5.0)
Alkaline Phosphatase: 112 U/L (ref 38–126)
Anion gap: 8 (ref 5–15)
BUN: 22 mg/dL (ref 8–23)
CO2: 27 mmol/L (ref 22–32)
Calcium: 10 mg/dL (ref 8.9–10.3)
Chloride: 108 mmol/L (ref 98–111)
Creatinine, Ser: 1.19 mg/dL (ref 0.61–1.24)
GFR, Estimated: >60 mL/min (ref >60)
Glucose, Bld: 98 mg/dL (ref 70–99)
Potassium: 4.5 mmol/L (ref 3.5–5.1)
Sodium: 143 mmol/L (ref 135–145)
Total Bilirubin: 1.2 mg/dL (ref 0.3–1.2)
Total Protein: 7.9 g/dL (ref 6.5–8.1)

## 2021-11-16 LAB — URINE DRUG SCREEN, QUALITATIVE (ARMC ONLY)
Amphetamines, Ur Screen: NOT DETECTED
Barbiturates, Ur Screen: NOT DETECTED
Benzodiazepine, Ur Scrn: NOT DETECTED
Cannabinoid 50 Ng, Ur ~~LOC~~: NOT DETECTED
Cocaine Metabolite,Ur ~~LOC~~: NOT DETECTED
MDMA (Ecstasy)Ur Screen: NOT DETECTED
Methadone Scn, Ur: NOT DETECTED
Opiate, Ur Screen: NOT DETECTED
Phencyclidine (PCP) Ur S: NOT DETECTED
Tricyclic, Ur Screen: NOT DETECTED

## 2021-11-16 NOTE — ED Triage Notes (Signed)
Pt comes via EMS with c/o sleepy and AMS. Pt was just picked up by neighbor from airport. Pt was at [place] with neighbor when he became confused and urinated on himself.   CBG-136 BP-148/96  Pt denies any pain.   Stroke neg per EMS.  Pt is A*OX4 at this time. Pt states he was visiting family in [place].

## 2021-11-16 NOTE — ED Provider Notes (Signed)
[facility] Provider Note   Event Date/Time   First MD Initiated Contact with Patient 11/16/21 1640     (approximate) History  Altered Mental Status  HPI Chris Finley is a 81 y.o. male with a past medical history of prostatic hypertrophy, past cocaine abuse, and hypercholesterolemia who presents for an episode of altered mental status/presyncope that occurred just prior to arrival after he got off a plane from [place].  Patient also states that he had an episode of urinary incontinence.  Patient's brother is at bedside and states that since he has been with patient, patient has been acting normally as well as denies any further urinary incontinence.  Patient currently denies any vision changes, tinnitus, difficulty speaking, facial droop, sore throat, chest pain, shortness of breath, abdominal pain, nausea/vomiting/diarrhea, dysuria, or weakness/numbness/paresthesias in any extremity Physical Exam  Triage Vital Signs: ED Triage Vitals  Enc Vitals Group     BP 11/16/21 1201 (!) 137/93     Pulse Rate 11/16/21 1201 87     Resp 11/16/21 1201 18     Temp 11/16/21 1201 97.8 F (36.6 C)     Temp Source 11/16/21 1201 Oral     SpO2 11/16/21 1201 94 %     Weight 11/16/21 1704 139 lb 15.9 oz (63.5 kg)     Height 11/16/21 1704 '5\' 7"'$  (1.702 m)     Head Circumference --      Peak Flow --      Pain Score 11/16/21 1143 0     Pain Loc --      Pain Edu? --      Excl. in [place]? --    Most recent vital signs: Vitals:   11/16/21 1201 11/16/21 1521  BP: (!) 137/93 (!) 132/100  Pulse: 87 84  Resp: 18 16  Temp: 97.8 F (36.6 C)   SpO2: 94% 96%   General: Awake, oriented x4. CV:  Good peripheral perfusion.  Resp:  Normal effort.  Abd:  No distention.  Other:  Elderly African-American male laying in bed in no distress.  Patient can accurately identify how many quarters are in 1.25, the current president, and object identification ED Results / Procedures / Treatments   Labs (all labs ordered are listed, but only abnormal results are displayed) Labs Reviewed  CBC - Abnormal; Notable for the following components:      Result Value   WBC 3.9 (*)    Platelets 99 (*)    All other components within normal limits  URINALYSIS, ROUTINE W REFLEX MICROSCOPIC - Abnormal; Notable for the following components:   Color, Urine YELLOW (*)    APPearance CLEAR (*)    Ketones, ur 20 (*)    Protein, ur 30 (*)    All other components within normal limits  COMPREHENSIVE METABOLIC PANEL  URINE DRUG SCREEN, QUALITATIVE ([facility] ONLY)  CBG MONITORING, ED   EKG ED ECG REPORT I, Naaman Plummer, the attending physician, personally viewed and interpreted this ECG. Date: 11/16/2021 EKG Time: 1209 Rate: 79 Rhythm: normal sinus rhythm QRS Axis: normal Intervals: Bifascicular block ST/T Wave abnormalities: normal Narrative Interpretation: Bifascicular block.  No evidence of acute ischemia RADIOLOGY ED MD interpretation: CT of the head without contrast interpreted by me shows no evidence of acute abnormalities including no intracerebral hemorrhage, obvious masses, or significant edema -Agree with radiology assessment Official radiology report(s): CT HEAD WO CONTRAST (5MM)  Result Date: 11/16/2021 CLINICAL DATA:  Altered mental status EXAM: CT HEAD WITHOUT  CONTRAST TECHNIQUE: Contiguous axial images were obtained from the base of the skull through the vertex without intravenous contrast. RADIATION DOSE REDUCTION: This exam was performed according to the departmental dose-optimization program which includes automated exposure control, adjustment of the mA and/or kV according to patient size and/or use of iterative reconstruction technique. COMPARISON:  11/09/2020 FINDINGS: Brain: No evidence of acute infarction, hemorrhage, hydrocephalus, extra-axial collection or mass lesion/mass effect. Periventricular and deep white matter hypodensity. Vascular: No hyperdense vessel or unexpected  calcification. Skull: Normal. Negative for fracture or focal lesion. Sinuses/Orbits: No acute finding. Other: None. IMPRESSION: No acute intracranial pathology. Small-vessel white matter disease. Electronically Signed   By: Delanna Ahmadi M.D.   On: 11/16/2021 12:51   PROCEDURES: Critical Care performed: No .1-3 Lead EKG Interpretation Performed by: Naaman Plummer, MD Authorized by: Naaman Plummer, MD     Interpretation: normal     ECG rate:  84   ECG rate assessment: normal     Rhythm: sinus rhythm     Ectopy: none     Conduction: normal   MEDICATIONS ORDERED IN ED: Medications - No data to display IMPRESSION / MDM / [place] / ED COURSE  I reviewed the triage vital signs and the nursing notes.                             Differential diagnosis includes, but is not limited to, arrhythmia, ACS, CVA, vasovagal syncope, UTI The patient is on the cardiac monitor to evaluate for evidence of arrhythmia and/or significant heart rate changes. Patient presents with complaints of presyncope ED Workup:  CBC, BMP, Troponin, ECG, head CT Differential diagnosis includes HF, ICH, seizure, stroke, HOCM, ACS, aortic dissection, malignant arrhythmia, or GI bleed. Findings: No evidence of acute laboratory abnormalities.  Troponin negative x1 EKG: No e/o STEMI. No evidence of Brugadas sign, delta wave, epsilon wave, significantly prolonged QTc, or malignant arrhythmia.  Disposition: Discharge. Patient is at baseline at this time. Return precautions expressed and understood in person. Advised follow up with primary care provider or clinic physician in next 24 hours.    FINAL CLINICAL IMPRESSION(S) / ED DIAGNOSES   Final diagnoses:  Near syncope  Urinary incontinence, unspecified type   Rx / DC Orders   ED Discharge Orders     None      Note:  This document was prepared using Dragon voice recognition software and may include unintentional dictation errors.   Naaman Plummer,  MD 11/16/21 1900

## 2022-06-28 DIAGNOSIS — I639 Cerebral infarction, unspecified: Secondary | ICD-10-CM

## 2022-06-28 HISTORY — DX: Cerebral infarction, unspecified: I63.9

## 2023-03-10 ENCOUNTER — Encounter: Payer: Self-pay | Admitting: Emergency Medicine

## 2023-03-10 ENCOUNTER — Other Ambulatory Visit: Payer: Self-pay

## 2023-03-10 ENCOUNTER — Emergency Department
Admission: EM | Admit: 2023-03-10 | Discharge: 2023-03-10 | Disposition: A | Discharge status: Home / Self Care | Payer: Medicare PPO | Attending: Student in an Organized Health Care Education/Training Program | Admitting: Student in an Organized Health Care Education/Training Program

## 2023-03-10 DIAGNOSIS — G629 Polyneuropathy, unspecified: Secondary | ICD-10-CM | POA: Diagnosis not present

## 2023-03-10 DIAGNOSIS — J449 Chronic obstructive pulmonary disease, unspecified: Secondary | ICD-10-CM | POA: Insufficient documentation

## 2023-03-10 DIAGNOSIS — M79604 Pain in right leg: Secondary | ICD-10-CM | POA: Diagnosis present

## 2023-03-10 NOTE — ED Triage Notes (Signed)
Pt presents ambulatory with cane to triage. Pt reports neuropathy bilateral legs for the past 2 weeks. Pt reports feet tingling a month ago. Pt seen at urgent care yesterday and was given Theraworx nerve spray. Pt reports improvement on the feet but not the legs. Pt A&Ox4

## 2023-03-10 NOTE — Discharge Instructions (Signed)
Please continue to use the nerve spray if you feel this is helping you.  Keep working on [facility]bal[facility].  Return to the ED if you have any worsening of your symptoms like increased pain, numbness or inability to walk.

## 2023-03-10 NOTE — ED Provider Notes (Signed)
[facility] Provider Note    Event Date/Time   First MD Initiated Contact with Patient 03/10/23 1154     (approximate)   History   Leg Pain   HPI  Chris Finley is a 82 y.o. male with PMH of COPD and panic attacks presents for evaluation of bilateral leg pain for the past 2 weeks.  Patient states is not really pain but a tingling on his anterior thighs.  He was given a nerve spray by urgent care and states this does help with the tingling in his feet.  He just wanted to make sure he was all right.      Physical Exam   Triage Vital Signs: ED Triage Vitals  Encounter Vitals Group     BP 03/10/23 1140 130/86     Systolic BP Percentile --      Diastolic BP Percentile --      Pulse Rate 03/10/23 1140 82     Resp 03/10/23 1140 18     Temp 03/10/23 1140 97.7 F (36.5 C)     Temp Source 03/10/23 1140 Oral     SpO2 03/10/23 1140 97 %     Weight 03/10/23 1141 128 lb (58.1 kg)     Height 03/10/23 1141 5\' 6"  (1.676 m)     Head Circumference --      Peak Flow --      Pain Score 03/10/23 1140 3     Pain Loc --      Pain Education --      Exclude from Growth Chart --     Most recent vital signs: Vitals:   03/10/23 1140  BP: 130/86  Pulse: 82  Resp: 18  Temp: 97.7 F (36.5 C)  SpO2: 97%   General: Awake, no distress.  CV:  Good peripheral perfusion.  Resp:  Normal effort.  Abd:  No distention.  Other:  No rash or lesions to the anterior thighs, sensation intact across all dermatomes, tibialis posterior pulse 2+ and regular, 5/5 strength in bilateral legs, patient able to walk using a cane.   ED Results / Procedures / Treatments   Labs (all labs ordered are listed, but only abnormal results are displayed) Labs Reviewed - No data to display   PROCEDURES:  Critical Care performed: No  Procedures   MEDICATIONS ORDERED IN ED: Medications - No data to display   IMPRESSION / MDM / ASSESSMENT AND PLAN / ED COURSE  I reviewed the  triage vital signs and the nursing notes.                              Differential diagnosis includes, but is not limited to, neuropathy, muscle strain, lumbar radiculopathy, sciatica, neuralgia paresthetica.  Patient's presentation is most consistent with acute, uncomplicated illness.  Patient's physical exam was very reassuring.  Since he has no loss of sensation or strength I feel he is appropriate for outpatient management.  I encouraged him to continue using the nerve spray if he feels that this is working for him.  His symptoms are fairly mild so I do not feel it is appropriate to start gabapentin which may cause some sedation, especially given his age.  Patient works with a CNA regularly to build strength.  I encouraged him to continue this.  He was given reassurance and expectant management.  I gave him return precautions.  Patient voiced understanding, all questions were answered  and he was stable at discharge.      FINAL CLINICAL IMPRESSION(S) / ED DIAGNOSES   Final diagnoses:  Neuropathy     Rx / DC Orders   ED Discharge Orders     None        Note:  This document was prepared using Dragon voice recognition software and may include unintentional dictation errors.   Cameron Ali, PA-C 03/10/23 1238    Willy Eddy, MD 03/10/23 1428

## 2023-07-18 ENCOUNTER — Other Ambulatory Visit: Payer: Self-pay

## 2023-07-18 ENCOUNTER — Emergency Department
Admission: EM | Admit: 2023-07-18 | Discharge: 2023-07-18 | Disposition: A | Discharge status: Home / Self Care | Payer: Medicare PPO | Attending: Emergency Medicine | Admitting: Emergency Medicine

## 2023-07-18 ENCOUNTER — Encounter: Payer: Self-pay | Admitting: Emergency Medicine

## 2023-07-18 DIAGNOSIS — R252 Cramp and spasm: Secondary | ICD-10-CM | POA: Insufficient documentation

## 2023-07-18 DIAGNOSIS — J449 Chronic obstructive pulmonary disease, unspecified: Secondary | ICD-10-CM | POA: Insufficient documentation

## 2023-07-18 LAB — COMPREHENSIVE METABOLIC PANEL
ALT: 12 U/L (ref 0–44)
AST: 23 U/L (ref 15–41)
Albumin: 3.9 g/dL (ref 3.5–5.0)
Alkaline Phosphatase: 106 U/L (ref 38–126)
Anion gap: 11 (ref 5–15)
BUN: 18 mg/dL (ref 8–23)
CO2: 25 mmol/L (ref 22–32)
Calcium: 9.5 mg/dL (ref 8.9–10.3)
Chloride: 104 mmol/L (ref 98–111)
Creatinine, Ser: 1.06 mg/dL (ref 0.61–1.24)
GFR, Estimated: >60 mL/min (ref >60)
Glucose, Bld: 85 mg/dL (ref 70–99)
Potassium: 4.2 mmol/L (ref 3.5–5.1)
Sodium: 140 mmol/L (ref 135–145)
Total Bilirubin: 0.8 mg/dL (ref 0.0–1.2)
Total Protein: 7.5 g/dL (ref 6.5–8.1)

## 2023-07-18 LAB — CK: Total CK: 74 U/L (ref 49–397)

## 2023-07-18 LAB — CBC WITH DIFFERENTIAL/PLATELET
Abs Immature Granulocytes: 0 10*3/uL (ref 0.00–0.07)
Basophils Absolute: 0 10*3/uL (ref 0.0–0.1)
Basophils Relative: 1 %
Eosinophils Absolute: 0.3 10*3/uL (ref 0.0–0.5)
Eosinophils Relative: 9 %
HCT: 44.1 % (ref 39.0–52.0)
Hemoglobin: 14.5 g/dL (ref 13.0–17.0)
Immature Granulocytes: 0 %
Lymphocytes Relative: 35 %
Lymphs Abs: 1.4 10*3/uL (ref 0.7–4.0)
MCH: 28.7 pg (ref 26.0–34.0)
MCHC: 32.9 g/dL (ref 30.0–36.0)
MCV: 87.2 fL (ref 80.0–100.0)
Monocytes Absolute: 0.5 10*3/uL (ref 0.1–1.0)
Monocytes Relative: 13 %
Neutro Abs: 1.6 10*3/uL — ABNORMAL LOW (ref 1.7–7.7)
Neutrophils Relative %: 42 %
Platelets: 104 10*3/uL — ABNORMAL LOW (ref 150–400)
RBC: 5.06 MIL/uL (ref 4.22–5.81)
RDW: 15.7 % — ABNORMAL HIGH (ref 11.5–15.5)
WBC: 3.8 10*3/uL — ABNORMAL LOW (ref 4.0–10.5)
nRBC: 0 % (ref 0.0–0.2)

## 2023-07-18 LAB — MAGNESIUM: Magnesium: 2.3 mg/dL (ref 1.7–2.4)

## 2023-07-18 NOTE — ED Provider Triage Note (Signed)
Emergency Medicine Provider Triage Evaluation Note  Chris Finley , a 83 y.o. male  was evaluated in triage.  Pt complains of muscle cramps to bilateral lower extremities x1 month. No fever.   Review of Systems  Positive: myalgias Negative: Fever, SOB  Physical Exam  There were no vitals taken for this visit. Gen:   Awake, no distress   Resp:  Normal effort  MSK:   Moves extremities without difficulty  Other:    Medical Decision Making  Medically screening exam initiated at 3:47 PM.  Appropriate orders placed.  Rahmeek Bartolotti Hackler was informed that the remainder of the evaluation will be completed by another provider, this initial triage assessment does not replace that evaluation, and the importance of remaining in the ED until their evaluation is complete.     Jackelyn Hoehn, PA-C 07/18/23 1555

## 2023-07-18 NOTE — ED Provider Notes (Signed)
[facility] Provider Note    Event Date/Time   First MD Initiated Contact with Patient 07/18/23 1827     (approximate)   History   No chief complaint on file.   HPI  Chris Finley is a 83 y.o. male with PMH of COPD, panic attack and CVA presents for evaluation of muscle cramps ongoing for 1 month.  Patient states that the muscle cramping occurs in both legs through the whole leg.  He denies pain at this time.      Physical Exam   Triage Vital Signs: ED Triage Vitals  Encounter Vitals Group     BP 07/18/23 1555 135/87     Systolic BP Percentile --      Diastolic BP Percentile --      Pulse Rate 07/18/23 1555 83     Resp --      Temp 07/18/23 1555 (!) 97.5 F (36.4 C)     Temp Source 07/18/23 1555 Oral     SpO2 07/18/23 1555 95 %     Weight 07/18/23 1549 140 lb (63.5 kg)     Height 07/18/23 1549 5\' 7"  (1.702 m)     Head Circumference --      Peak Flow --      Pain Score 07/18/23 1549 0     Pain Loc --      Pain Education --      Exclude from Growth Chart --     Most recent vital signs: Vitals:   07/18/23 1555  BP: 135/87  Pulse: 83  Temp: (!) 97.5 F (36.4 C)  SpO2: 95%   General: Awake, no distress.  CV:  Good peripheral perfusion.  RRR. Resp:  Normal effort.  CTAB. Abd:  No distention.  Other:  5/5 strength in bilateral lower extremities, walks with a cane without difficulty, sensation intact across all dermatomes, dorsalis pedis pulses 2+ and regular   ED Results / Procedures / Treatments   Labs (all labs ordered are listed, but only abnormal results are displayed) Labs Reviewed  CBC WITH DIFFERENTIAL/PLATELET - Abnormal; Notable for the following components:      Result Value   WBC 3.8 (*)    RDW 15.7 (*)    Platelets 104 (*)    Neutro Abs 1.6 (*)    All other components within normal limits  COMPREHENSIVE METABOLIC PANEL  CK  MAGNESIUM    PROCEDURES:  Critical Care performed: No  Procedures   MEDICATIONS  ORDERED IN ED: Medications - No data to display   IMPRESSION / MDM / ASSESSMENT AND PLAN / ED COURSE  I reviewed the triage vital signs and the nursing notes.                             83 year old male presents for evaluation of muscle cramps in bilateral legs.  Vital signs are stable and patient is NAD on exam.  Differential diagnosis includes, but is not limited to, muscle strain, electrolyte abnormality, viral infection, restless leg syndrome, medication side effect.  Patient's presentation is most consistent with acute complicated illness / injury requiring diagnostic workup.  CMP, CBC, CK and magnesium all unremarkable.  Physical exam is reassuring and I did not identify any areas of muscle weakness.  He denies pain at this time.  Patient describes that his symptoms are worse in the evening which may be consistent with restless leg syndrome.  He can have  Tylenol and ibuprofen as needed for pain. Patient is not currently taking any medications that I would expect to cause muscle cramps like a statin.  I advised him to stay well-hydrated and follow-up with his primary care provider regarding his symptoms.  I do not believe there is a life-threatening cause of the muscle cramps at this time.  He was discharged in stable condition.      FINAL CLINICAL IMPRESSION(S) / ED DIAGNOSES   Final diagnoses:  Muscle cramps     Rx / DC Orders   ED Discharge Orders     None        Note:  This document was prepared using Dragon voice recognition software and may include unintentional dictation errors.   Cameron Ali, PA-C 07/18/23 1925    Merwyn Katos, MD 07/18/23 [phone]

## 2023-07-18 NOTE — ED Triage Notes (Signed)
Pt in via POV, reports ongoing muscle cramps x 1 month.  States he has hx of same but now they run throughout length of legs.  Ambulatory to triage w/ cane.  NAD noted at this time.

## 2023-07-18 NOTE — Discharge Instructions (Signed)
Your lab work today was normal. Please follow up with your primary care provider. Make sure you stay well hydrated.

## 2024-01-27 ENCOUNTER — Other Ambulatory Visit: Payer: Self-pay

## 2024-01-27 DIAGNOSIS — R2231 Localized swelling, mass and lump, right upper limb: Secondary | ICD-10-CM

## 2024-01-27 DIAGNOSIS — R222 Localized swelling, mass and lump, trunk: Secondary | ICD-10-CM

## 2024-02-03 ENCOUNTER — Ambulatory Visit
Admission: RE | Admit: 2024-02-03 | Discharge: 2024-02-03 | Disposition: A | Discharge status: Home / Self Care | Source: Ambulatory Visit

## 2024-02-03 DIAGNOSIS — R2231 Localized swelling, mass and lump, right upper limb: Secondary | ICD-10-CM | POA: Insufficient documentation

## 2024-02-03 DIAGNOSIS — R222 Localized swelling, mass and lump, trunk: Secondary | ICD-10-CM | POA: Insufficient documentation
